# Patient Record
Sex: Female | Born: 1975 | Race: Black or African American | Hispanic: No | Marital: Married | Smoking: Never smoker
Health system: Southern US, Community
[De-identification: ages and names within clinical notes are randomized; demographics above are authoritative.]

## PROBLEM LIST (undated history)

## (undated) DIAGNOSIS — L255 Unspecified contact dermatitis due to plants, except food: Secondary | ICD-10-CM

## (undated) DIAGNOSIS — M722 Plantar fascial fibromatosis: Secondary | ICD-10-CM

## (undated) DIAGNOSIS — M76821 Posterior tibial tendinitis, right leg: Secondary | ICD-10-CM

## (undated) DIAGNOSIS — L600 Ingrowing nail: Secondary | ICD-10-CM

## (undated) DIAGNOSIS — M76822 Posterior tibial tendinitis, left leg: Secondary | ICD-10-CM

## (undated) DIAGNOSIS — I10 Essential (primary) hypertension: Secondary | ICD-10-CM

## (undated) HISTORY — DX: Posterior tibial tendinitis, right leg: M76.821

## (undated) HISTORY — DX: Posterior tibial tendinitis, right leg: M76.822

## (undated) HISTORY — DX: Plantar fascial fibromatosis: M72.2

## (undated) HISTORY — DX: Ingrowing nail: L60.0

## (undated) HISTORY — PX: TUBAL LIGATION: SHX77

## (undated) HISTORY — DX: Essential (primary) hypertension: I10

## (undated) HISTORY — DX: Unspecified contact dermatitis due to plants, except food: L25.5

---

## 1998-12-22 ENCOUNTER — Emergency Department (HOSPITAL_COMMUNITY): Admission: EM | Admit: 1998-12-22 | Discharge: 1998-12-22 | Payer: Self-pay | Admitting: Emergency Medicine

## 2008-07-29 ENCOUNTER — Ambulatory Visit: Payer: Self-pay | Admitting: Diagnostic Radiology

## 2008-07-29 ENCOUNTER — Emergency Department (HOSPITAL_BASED_OUTPATIENT_CLINIC_OR_DEPARTMENT_OTHER): Admission: EM | Admit: 2008-07-29 | Discharge: 2008-07-29 | Payer: Self-pay | Admitting: Emergency Medicine

## 2009-09-05 ENCOUNTER — Emergency Department (HOSPITAL_BASED_OUTPATIENT_CLINIC_OR_DEPARTMENT_OTHER): Admission: EM | Admit: 2009-09-05 | Discharge: 2009-09-05 | Payer: Self-pay | Admitting: Emergency Medicine

## 2010-04-26 ENCOUNTER — Encounter: Payer: Self-pay | Admitting: Specialist

## 2013-01-15 DIAGNOSIS — I519 Heart disease, unspecified: Secondary | ICD-10-CM | POA: Insufficient documentation

## 2013-01-30 DIAGNOSIS — I10 Essential (primary) hypertension: Secondary | ICD-10-CM | POA: Insufficient documentation

## 2013-01-31 DIAGNOSIS — E559 Vitamin D deficiency, unspecified: Secondary | ICD-10-CM | POA: Insufficient documentation

## 2013-07-19 DIAGNOSIS — J309 Allergic rhinitis, unspecified: Secondary | ICD-10-CM | POA: Insufficient documentation

## 2013-07-19 DIAGNOSIS — Z6841 Body Mass Index (BMI) 40.0 and over, adult: Secondary | ICD-10-CM | POA: Insufficient documentation

## 2013-07-19 DIAGNOSIS — G4733 Obstructive sleep apnea (adult) (pediatric): Secondary | ICD-10-CM | POA: Insufficient documentation

## 2014-02-14 ENCOUNTER — Other Ambulatory Visit: Payer: Self-pay | Admitting: *Deleted

## 2014-02-14 ENCOUNTER — Ambulatory Visit (INDEPENDENT_AMBULATORY_CARE_PROVIDER_SITE_OTHER): Payer: BC Managed Care – PPO

## 2014-02-14 ENCOUNTER — Encounter: Payer: Self-pay | Admitting: Podiatry

## 2014-02-14 ENCOUNTER — Ambulatory Visit (INDEPENDENT_AMBULATORY_CARE_PROVIDER_SITE_OTHER): Payer: BC Managed Care – PPO | Admitting: Podiatry

## 2014-02-14 VITALS — BP 162/95 | HR 86 | Resp 16 | Ht 61.5 in | Wt 265.0 lb

## 2014-02-14 DIAGNOSIS — M722 Plantar fascial fibromatosis: Secondary | ICD-10-CM | POA: Diagnosis not present

## 2014-02-14 DIAGNOSIS — G629 Polyneuropathy, unspecified: Secondary | ICD-10-CM

## 2014-02-14 MED ORDER — MELOXICAM 15 MG PO TABS: 15.0000 mg | ORAL_TABLET | Freq: Every day | ORAL | Status: DC

## 2014-02-14 MED ORDER — METHYLPREDNISOLONE (PAK) 4 MG PO TABS: ORAL_TABLET | ORAL | Status: DC

## 2014-02-14 NOTE — Progress Notes (Signed)
   Subjective:    Patient ID: Melissa Norris, female    DOB: 1975-11-11, 38 y.o.   MRN: [id number]  HPI Comments: Left foot was having heel pain , it has been going on for about a year now maybe more . Saw another doctor it got better for about 3 months , but it continued and stop seeing the other  Doctor , got frustrated with it. Its worse in the mornings can hardly walk. But now both feet are burning and stinging. It just started a couple of months ago.   Foot Pain      Review of Systems  All other systems reviewed and are negative.      Objective:   Physical Exam: I have reviewed her past history medications allergies surgery social history and review of systems. Pulses are strongly palpable bilateral. Neurologic sensorium is intact per Semmes-Weinstein monofilament. Deep tendon reflexes are intact bilateral muscle strength is 5 over 5 dorsiflexion plantar flexors and inverters and evertors onto the musculature is intact. Orthopedic evaluation demonstrates mild pes planus bilateral pain on palpation medially continue to both the left heel. Radiographs confirm soft tissue increase in density at the plantar fascial insertion site left.        Assessment & Plan:  Assessment: Neuropathic changes secondary to history. Plantar fasciitis left.  Plan: Discussed etiology pathology conservative versus surgical therapies we discussed appropriate shoe gear stretching exercises ice therapy and she modifications. Injected the left heel today with Kenalog and local anesthetic dispensed a night brace as well as a plantar fascial brace. Discussed appropriate shoes wrote a prescription for Medrol Dosepak to be followed by meloxicam. I will follow-up with her in 1 month.

## 2014-02-14 NOTE — Patient Instructions (Signed)

## 2014-03-14 ENCOUNTER — Ambulatory Visit: Payer: BC Managed Care – PPO | Admitting: Podiatry

## 2014-03-21 ENCOUNTER — Ambulatory Visit (INDEPENDENT_AMBULATORY_CARE_PROVIDER_SITE_OTHER): Payer: BC Managed Care – PPO | Admitting: Podiatry

## 2014-03-21 ENCOUNTER — Encounter: Payer: Self-pay | Admitting: Podiatry

## 2014-03-21 ENCOUNTER — Ambulatory Visit: Payer: BC Managed Care – PPO | Admitting: Podiatry

## 2014-03-21 DIAGNOSIS — M722 Plantar fascial fibromatosis: Secondary | ICD-10-CM

## 2014-03-23 NOTE — Progress Notes (Signed)
She presents today for follow-up of her left heel pain. She states that is recurring.  Objective: Vital signs are stable she is alert and oriented 3 pulses are palpable. No calf pain left though she does have pain on palpation medial calcaneal tubercle of the left foot.  Assessment: Chronic intractable plantar fasciitis left foot.  Plan: Injected the left heel again today with Kenalog and local anesthetic. Continue all conservative therapies. She was also scanned for set of orthotics. I will follow up with her once those arrive.

## 2014-04-08 ENCOUNTER — Ambulatory Visit: Payer: BC Managed Care – PPO | Admitting: *Deleted

## 2014-04-08 DIAGNOSIS — M722 Plantar fascial fibromatosis: Secondary | ICD-10-CM

## 2014-04-08 NOTE — Progress Notes (Signed)
Patient presents for orthotic pick up  

## 2014-04-08 NOTE — Patient Instructions (Signed)

## 2014-08-02 ENCOUNTER — Other Ambulatory Visit: Payer: Self-pay | Admitting: Podiatry

## 2014-09-10 ENCOUNTER — Other Ambulatory Visit: Payer: Self-pay | Admitting: Podiatry

## 2014-10-23 ENCOUNTER — Other Ambulatory Visit: Payer: Self-pay | Admitting: Podiatry

## 2015-03-03 DIAGNOSIS — R202 Paresthesia of skin: Secondary | ICD-10-CM | POA: Insufficient documentation

## 2015-03-11 ENCOUNTER — Encounter: Payer: Self-pay | Admitting: Podiatry

## 2015-03-11 ENCOUNTER — Ambulatory Visit: Payer: 59 | Admitting: Podiatry

## 2015-03-11 ENCOUNTER — Ambulatory Visit (INDEPENDENT_AMBULATORY_CARE_PROVIDER_SITE_OTHER): Payer: 59 | Admitting: Podiatry

## 2015-03-11 VITALS — BP 118/69 | HR 89 | Resp 16

## 2015-03-11 DIAGNOSIS — M722 Plantar fascial fibromatosis: Secondary | ICD-10-CM | POA: Diagnosis not present

## 2015-03-11 MED ORDER — DICLOFENAC SODIUM 75 MG PO TBEC: 75.0000 mg | DELAYED_RELEASE_TABLET | Freq: Two times a day (BID) | ORAL | Status: DC

## 2015-03-11 MED ORDER — METHYLPREDNISOLONE 4 MG PO TBPK: ORAL_TABLET | ORAL | Status: DC

## 2015-03-11 NOTE — Progress Notes (Signed)
She presents today for follow-up of her plantar fasciitis of her left foot. She states that it went away for quite a while and then has resurfaced. She's done nothing for it.  Objective: Vital signs are stable she is alert and oriented 3. Ulcers are strongly palpable. Neurologic sensorium is intact. She has pain on palpation medial calcaneal tubercle of the left heel.  Assessment: Pain in limb secondary to plantar fasciitis left.  Plan: Start her on a Medrol Dosepak to be followed by meloxicam. Injected the left heel today. Discussed the need for orthotics which she has lost over the past year. I will follow-up with her in approximately one month.

## 2015-04-22 ENCOUNTER — Ambulatory Visit: Payer: 59 | Admitting: Podiatry

## 2015-05-01 ENCOUNTER — Ambulatory Visit (INDEPENDENT_AMBULATORY_CARE_PROVIDER_SITE_OTHER): Payer: BLUE CROSS/BLUE SHIELD | Admitting: Podiatry

## 2015-05-01 ENCOUNTER — Encounter: Payer: Self-pay | Admitting: Podiatry

## 2015-05-01 VITALS — BP 136/82 | HR 76 | Resp 12

## 2015-05-01 DIAGNOSIS — M722 Plantar fascial fibromatosis: Secondary | ICD-10-CM | POA: Diagnosis not present

## 2015-05-01 NOTE — Progress Notes (Signed)
She states that she presents today for follow-up of plantar fasciitis to her left heel. She states that it was doing much better for a week or 2 and then the medicine stopped helping. She says however when it wore off she is still 50% better than she was prior to getting an injection.  Objective: Vital signs stable alert and oriented 3. Pulses are strongly palpable. She has pain on palpation medial calcaneal tubercle of the left heel.  Assessment: Chronic intractable plantar fasciitis of the left foot slowly resolving approximately 50%.  Plan: Discussed etiology pathology conservative versus surgical therapies at this point I reinjected her left tilt today she will continue anti-inflammatories and we discussed the possible need for surgery. I will follow-up with her in 1 month at which time we will consider orthotics and discussed surgery if necessary.

## 2015-06-03 ENCOUNTER — Ambulatory Visit: Payer: BLUE CROSS/BLUE SHIELD | Admitting: Podiatry

## 2015-06-12 ENCOUNTER — Ambulatory Visit (INDEPENDENT_AMBULATORY_CARE_PROVIDER_SITE_OTHER): Payer: BLUE CROSS/BLUE SHIELD | Admitting: Podiatry

## 2015-06-12 ENCOUNTER — Encounter: Payer: Self-pay | Admitting: Podiatry

## 2015-06-12 VITALS — BP 113/80 | HR 92 | Resp 12

## 2015-06-12 DIAGNOSIS — M722 Plantar fascial fibromatosis: Secondary | ICD-10-CM

## 2015-06-12 NOTE — Progress Notes (Signed)
She presents today for follow-up of her plantar fasciitis her left foot. She states that she is approximately 60% improved at this point.  Objective: Vital signs are stable she is alert and oriented 3. Pulses are palpable. She has pain on palpation medially continue tubercle of her left heel.  Assessment: Chronic intractable plantar fasciitis left heel.  Plan: She was scanned for some orthotics today and local anesthetic with Kenalog was injected her left heel. She will continue the use of all of her conservative therapies including plantar fascial brace night splint and medications. Follow up with her once her orthotics come in.

## 2015-07-08 ENCOUNTER — Ambulatory Visit: Payer: BLUE CROSS/BLUE SHIELD | Admitting: *Deleted

## 2015-07-08 DIAGNOSIS — M722 Plantar fascial fibromatosis: Secondary | ICD-10-CM

## 2015-07-08 NOTE — Patient Instructions (Signed)

## 2015-07-08 NOTE — Progress Notes (Signed)
Patient ID: Melissa Norris, female   DOB: Oct 13, 1975, 40 y.o.   MRN: [id number] Patient presents for orthotic pick up.  Verbal and written break in and wear instructions given.  Patient will follow up in 4 weeks if symptoms worsen or fail to improve.

## 2016-07-27 ENCOUNTER — Ambulatory Visit (INDEPENDENT_AMBULATORY_CARE_PROVIDER_SITE_OTHER): Payer: BLUE CROSS/BLUE SHIELD | Admitting: Podiatry

## 2016-07-27 ENCOUNTER — Encounter: Payer: Self-pay | Admitting: Podiatry

## 2016-07-27 ENCOUNTER — Ambulatory Visit: Payer: BLUE CROSS/BLUE SHIELD

## 2016-07-27 ENCOUNTER — Ambulatory Visit (INDEPENDENT_AMBULATORY_CARE_PROVIDER_SITE_OTHER): Payer: BLUE CROSS/BLUE SHIELD

## 2016-07-27 VITALS — BP 130/91 | HR 83 | Resp 16

## 2016-07-27 DIAGNOSIS — M79671 Pain in right foot: Secondary | ICD-10-CM

## 2016-07-27 DIAGNOSIS — M722 Plantar fascial fibromatosis: Secondary | ICD-10-CM

## 2016-07-27 MED ORDER — METHYLPREDNISOLONE 4 MG PO TBPK
ORAL_TABLET | ORAL | 0 refills | Status: DC
Start: 2016-07-27 — End: 2016-12-20

## 2016-07-27 MED ORDER — MELOXICAM 15 MG PO TABS: 15.0000 mg | ORAL_TABLET | Freq: Every day | ORAL | 3 refills | Status: DC

## 2016-07-27 NOTE — Progress Notes (Signed)
She presents today with a chief complaint of painful heels bilaterally. She states that I think he'll have a recurrence. She states the left foot seems to be doing a little better but the right one has started active lately.  Objective: Vital signs are stable she is alert and oriented 3. Pulses are palpable. Neurologic sensorium is intact. She has pain on palpation medially intervals bilateral radiographs taken today demonstrate soft tissue increase in density cannula insertion site consistent of plantar fasciitis.  Assessment: Plantar fasciitis bilateral.  Plan: Injected the bilateral heels today with Kenalog and local anesthetic she was started utilizing her plantar fascia brace to the left foot which she has at home and placed her in a new one for the right foot. He also place her in a knee plantar fascia brace night splint. Start her on a Medrol Dosepak to be followed by meloxicam. Discussed appropriate shoe gear stretching exercises ice therapy and shoe modifications. I will follow up with her in 1 month.

## 2016-08-31 ENCOUNTER — Ambulatory Visit: Payer: BLUE CROSS/BLUE SHIELD | Admitting: Podiatry

## 2016-09-09 ENCOUNTER — Encounter: Payer: Self-pay | Admitting: Podiatry

## 2016-09-09 ENCOUNTER — Ambulatory Visit (INDEPENDENT_AMBULATORY_CARE_PROVIDER_SITE_OTHER): Payer: BLUE CROSS/BLUE SHIELD | Admitting: Podiatry

## 2016-09-09 DIAGNOSIS — M722 Plantar fascial fibromatosis: Secondary | ICD-10-CM | POA: Diagnosis not present

## 2016-09-09 NOTE — Progress Notes (Signed)
She presents today for follow-up of her plantar fasciitis she states that is doing somewhat better particularly the left was doing really well around so painful.  Objective: Vital signs stable she is alert and oriented 3. Pulses are palpable. She still has pain on palpation medial calcaneal tubercles bilateral.  Assessment: Pain and limb secondary to plantar fasciitis bilateral.  Plan: Reinjection bilateral heels lived alone local anesthetic. Follow-up with her in 1 month if necessary.

## 2016-10-21 ENCOUNTER — Ambulatory Visit: Payer: BLUE CROSS/BLUE SHIELD | Admitting: Podiatry

## 2016-12-20 ENCOUNTER — Encounter (HOSPITAL_BASED_OUTPATIENT_CLINIC_OR_DEPARTMENT_OTHER): Payer: Self-pay

## 2016-12-20 ENCOUNTER — Emergency Department (HOSPITAL_BASED_OUTPATIENT_CLINIC_OR_DEPARTMENT_OTHER)
Admission: EM | Admit: 2016-12-20 | Discharge: 2016-12-20 | Disposition: A | Discharge status: Home / Self Care | Payer: BLUE CROSS/BLUE SHIELD | Attending: Emergency Medicine | Admitting: Emergency Medicine

## 2016-12-20 DIAGNOSIS — Z79899 Other long term (current) drug therapy: Secondary | ICD-10-CM | POA: Insufficient documentation

## 2016-12-20 DIAGNOSIS — M546 Pain in thoracic spine: Secondary | ICD-10-CM | POA: Insufficient documentation

## 2016-12-20 DIAGNOSIS — M549 Dorsalgia, unspecified: Secondary | ICD-10-CM | POA: Diagnosis present

## 2016-12-20 DIAGNOSIS — I10 Essential (primary) hypertension: Secondary | ICD-10-CM | POA: Insufficient documentation

## 2016-12-20 MED ORDER — BACLOFEN 10 MG PO TABS: 10.0000 mg | ORAL_TABLET | Freq: Three times a day (TID) | ORAL | 0 refills | Status: DC

## 2016-12-20 MED ORDER — NAPROXEN 375 MG PO TABS: 375.0000 mg | ORAL_TABLET | Freq: Two times a day (BID) | ORAL | 0 refills | Status: DC

## 2016-12-20 NOTE — ED Triage Notes (Signed)
C/o pain to right mid back/rib area-pain worse with deep breath and worse with movement x 3 days-denies injury-NAD-steady gait

## 2016-12-20 NOTE — ED Provider Notes (Signed)
[facility]   CSN: [id number] Arrival date & time: 12/20/16  2028     History   Chief Complaint Chief Complaint  Patient presents with  . Back Pain    HPI Melissa Norris is a 41 y.o. female.  Melissa Norris is a 41 y.o. Female who presents in the emergency department complaining of right upper back pain for the past 3 days. Patient reports she has pain to the lateral aspect of her right upper back ongoing for 3 days now. She reports her pain is worse with movement. Her pain is not worse with deep inspiration. She reports her pain is better with massage by her husband. She took naproxen a few days ago with little relief of her symptoms. She denies any injury or trauma to her back. 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 She denies having any chest pain or shortness of breath. She denies fevers, coughing, shortness of breath, palpitations, chest pain, abdominal pain, vomiting, loss of bladder control, loss of bowel control, urinary symptoms, numbness, tingling, weakness, history of cancer, history of drug use, or rashes.   The history is provided by the patient, medical records and the spouse. No language interpreter was used.  Back Pain   Pertinent negatives include no chest pain, no fever, no numbness, no dysuria and no weakness.    Past Medical History:  Diagnosis Date  . Hypertension     Patient Active Problem List   Diagnosis Date Noted  . Hand paresthesia 03/03/2015  . Body mass index (BMI) of 50-59.9 in adult (HCC) 07/19/2013  . Allergic rhinitis 07/19/2013  . Obstructive apnea 07/19/2013  . Avitaminosis D 01/31/2013  . Essential (primary) hypertension 01/30/2013  . Cardiac disease 01/15/2013    Past Surgical History:  Procedure Laterality Date  . TUBAL LIGATION      OB History    No data available       Home Medications    Prior to  Admission medications   Medication Sig Start Date End Date Taking? Authorizing Provider  baclofen (LIORESAL) 10 MG tablet Take 1 tablet (10 mg total) by mouth 3 (three) times daily. 12/20/16   Everlene Farrier, PA-C  carvedilol (COREG) 25 MG tablet  12/14/13   [provider]  Cyanocobalamin (VITAMIN B-12 PO) Take 1 tablet by mouth daily.    [provider]  ergocalciferol (VITAMIN D2) 50000 UNITS capsule  01/08/15   [provider]  hydrochlorothiazide (HYDRODIURIL) 25 MG tablet  02/12/15   [provider]  naproxen (NAPROSYN) 375 MG tablet Take 1 tablet (375 mg total) by mouth 2 (two) times daily with a meal. 12/20/16   Everlene Farrier, PA-C  Vitamin D, Ergocalciferol, (DRISDOL) 50000 UNITS CAPS capsule Take by mouth. 01/31/13   [provider]    Family History No family history on file.  Social History Social History  Substance Use Topics  . Smoking status: Never Smoker  . Smokeless tobacco: Never Used  . Alcohol use No     Allergies   Patient has no known allergies.   Review of Systems Review of Systems  Constitutional: Negative for fever.  Respiratory: Negative for cough and shortness of breath.   Cardiovascular: Negative for chest pain and palpitations.  Gastrointestinal: Negative for vomiting.  Genitourinary: Negative for difficulty urinating, dysuria, flank pain, frequency and hematuria.  Musculoskeletal: Positive for back pain. Negative for neck pain.  Skin: Negative for rash and wound.  Neurological: Negative for dizziness, weakness, light-headedness and numbness.     Physical Exam Updated Vital Signs BP 138/89 (BP Location: Left Arm)   Pulse 94   Temp 99.2 F (37.3 C) (Oral)   Resp 20   Ht  (1.549 m)   Wt 135.6 kg (299 lb)   LMP 12/11/2016   SpO2 98%   BMI 56.50 kg/m   Physical Exam  Constitutional: She appears well-developed and well-nourished. No distress.  Nontoxic appearing.  HENT:  Head:  Normocephalic and atraumatic.  Eyes: Conjunctivae are normal. Right eye exhibits no discharge. Left eye exhibits no discharge.  Neck: Neck supple. No JVD present.  Cardiovascular: Normal rate, regular rhythm, normal heart sounds and intact distal pulses.   Pulmonary/Chest: Effort normal and breath sounds normal. No respiratory distress. She has no wheezes. She has no rales. She exhibits no tenderness.  Lungs are clear to ascultation bilaterally. Symmetric chest expansion bilaterally. No increased work of breathing. No rales or rhonchi.    Abdominal: Soft. There is no tenderness. There is no guarding.  Musculoskeletal: Normal range of motion. She exhibits tenderness. She exhibits no edema or deformity.       Back:  No midline neck or back tenderness palpation. Patient has reproducible pain to the lateral aspect of her right upper back along her rhomboid musculature. No overlying skin changes. Good strength in her bilateral upper extremities.  Lymphadenopathy:    She has no cervical adenopathy.  Neurological: She is alert. She displays normal reflexes. No sensory deficit. She exhibits normal muscle tone. Coordination normal.  Sensation and strength is intact to her bilateral upper and lower extremities. Bilateral patellar DTRs are intact. Normal gait.  Skin: Skin is warm and dry. Capillary refill takes less than 2 seconds. No rash noted. She is not diaphoretic. No erythema. No pallor.  Psychiatric: She has a normal mood and affect. Her behavior is normal.  Nursing note and vitals reviewed.    ED Treatments / Results  Labs (all labs ordered are listed, but only abnormal results are displayed) Labs Reviewed - No data to display  EKG  EKG Interpretation None       Radiology No results found.  Procedures Procedures (including critical care time)  Medications Ordered in ED Medications - No data to display   Initial Impression / Assessment and Plan / ED Course  I have reviewed the  triage vital signs and the nursing notes.  Pertinent labs & imaging results that were available during my care of the patient were reviewed by me and considered in my medical decision making (see chart for details).     This  is a 41 y.o. Female who presents in the emergency department complaining of right upper back pain for the past 3 days. Patient reports she has pain to the lateral aspect of her right upper back ongoing for 3 days now. She reports her pain is worse with movement. Her pain is not worse with deep inspiration. She reports her pain is better with massage by her husband. She took naproxen a few days ago with little relief of her symptoms. She denies any injury or trauma to her back. She does report she carries a 17-month-old baby frequently with this right arm and believes this may be contributing to her symptoms. She denies having any chest pain or shortness of breath. On exam the patient is afebrile nontoxic appearing. She has no focal neurological deficits. Normal gait. No  midline neck or back tenderness. No loss of bowel or bladder control. She has reproducible tenderness to her right lateral thoracic back along her rhomboid musculature. No midline neck or back tenderness. No overlying skin changes. This seems to be musculoskeletal pain. She has no chest pain. We will discharge with prescription for flexeril and naproxen. I discussed precautions in taking baclofen. She is not breast-feeding. I encouraged her to follow up with primary care and discussed return precautions. I advised the patient to follow-up with their primary care provider this week. I advised the patient to return to the emergency department with new or worsening symptoms or new concerns. The patient verbalized understanding and agreement with plan.      Final Clinical Impressions(s) / ED Diagnoses   Final diagnoses:  Acute right-sided thoracic back pain    New Prescriptions New Prescriptions   BACLOFEN  (LIORESAL) 10 MG TABLET    Take 1 tablet (10 mg total) by mouth 3 (three) times daily.   NAPROXEN (NAPROSYN) 375 MG TABLET    Take 1 tablet (375 mg total) by mouth 2 (two) times daily with a meal.     Everlene Farrier, PA-C 12/20/16 2318    Lavera Guise, MD 12/21/16 [phone]

## 2017-06-17 ENCOUNTER — Other Ambulatory Visit: Payer: Self-pay | Admitting: Podiatry

## 2017-11-03 ENCOUNTER — Ambulatory Visit: Payer: BLUE CROSS/BLUE SHIELD | Admitting: Podiatry

## 2017-11-03 ENCOUNTER — Encounter: Payer: Self-pay | Admitting: Podiatry

## 2017-11-03 DIAGNOSIS — M722 Plantar fascial fibromatosis: Secondary | ICD-10-CM

## 2017-11-03 MED ORDER — MELOXICAM 15 MG PO TABS: 15.0000 mg | ORAL_TABLET | Freq: Every day | ORAL | 3 refills | Status: DC

## 2017-11-05 NOTE — Progress Notes (Signed)
She presents today for follow-up of bilateral heels states that is sharp shooting pains coming from the arch shooting up into the heels and ankles.  Last time I saw her for this was September 09, 2016.  States that she is currently now working for [employer].  Objective: Vital signs are stable she is alert and oriented x3.  Pulses are palpable.  Neurologic sensorium is intact degenerative flexors are intact muscle strength normal symmetrical.  PT evaluation and straight all joints distal ankle full range of motion without crepitation.  She has pain on palpation medial calcaneal tubercles bilateral consistent with plantar fasciitis.  Assessment: Plantar fasciitis bilateral.  Plan: I reinjected her today with 20 mg Kenalog 5 mg Marcaine point maximal tenderness medial aspect of bilateral heels.  Tolerated procedure well.  I also had her size for orthotics again today.  I will follow-up with her once this come in.  I refilled her meloxicam.

## 2017-11-08 ENCOUNTER — Telehealth: Payer: Self-pay | Admitting: Podiatry

## 2017-11-08 NOTE — Telephone Encounter (Signed)
Called pt with orthotic benefits and they are covered at 100% if billed with office visit and pt pays $20.00 copay. Pt would like to proceed with the orthotics.

## 2017-11-24 ENCOUNTER — Ambulatory Visit: Payer: BLUE CROSS/BLUE SHIELD | Admitting: Podiatry

## 2017-11-24 ENCOUNTER — Encounter: Payer: Self-pay | Admitting: Podiatry

## 2017-11-24 DIAGNOSIS — M76822 Posterior tibial tendinitis, left leg: Secondary | ICD-10-CM

## 2017-11-24 NOTE — Progress Notes (Signed)
She presents today for follow-up of bilateral plantar fasciitis and to pick up her orthotics.  She states that her orthotics hopefully will help.  She states that her right foot is doing very well.  Her left foot is still painful.  Objective: Vital signs are stable she is alert and oriented x3.  Pulses are palpable.  No pain on palpation medial calcaneal tubercle of the right foot.  No pain on palpation of the plantar fascia of the left foot but she does have pain on palpation of the posterior tibial tendon near its insertion site on the navicular tuberosity and she also has pain on inversion against resistance.  Assessment: Pain in limb secondary to posterior tibial tendinitis more than likely compensatory from plantar fasciitis.  Well-healed plantar fasciitis right foot.  Plan: At this point I injected a small amount of Kenalog 20 mg and 10 mg of Marcaine to the point of maximal tenderness not going into the tendon but around the tendon and tendon sheath of the posterior tibial tendon near its insertion on the navicular.  She tolerated this procedure well and I expect her to start wearing her orthotics immediately and I will follow-up with her in 1 month as will Raiford NobleRick if any adjustments need to be made.

## 2017-12-22 ENCOUNTER — Ambulatory Visit: Payer: BLUE CROSS/BLUE SHIELD | Admitting: Podiatry

## 2017-12-22 ENCOUNTER — Other Ambulatory Visit: Payer: BLUE CROSS/BLUE SHIELD | Admitting: Orthotics

## 2017-12-28 ENCOUNTER — Emergency Department (HOSPITAL_BASED_OUTPATIENT_CLINIC_OR_DEPARTMENT_OTHER)
Admission: EM | Admit: 2017-12-28 | Discharge: 2017-12-28 | Disposition: A | Discharge status: Home / Self Care | Payer: BLUE CROSS/BLUE SHIELD | Attending: Emergency Medicine | Admitting: Emergency Medicine

## 2017-12-28 ENCOUNTER — Encounter (HOSPITAL_BASED_OUTPATIENT_CLINIC_OR_DEPARTMENT_OTHER): Payer: Self-pay | Admitting: Emergency Medicine

## 2017-12-28 ENCOUNTER — Other Ambulatory Visit: Payer: Self-pay

## 2017-12-28 ENCOUNTER — Emergency Department (HOSPITAL_BASED_OUTPATIENT_CLINIC_OR_DEPARTMENT_OTHER): Payer: BLUE CROSS/BLUE SHIELD

## 2017-12-28 DIAGNOSIS — Y939 Activity, unspecified: Secondary | ICD-10-CM | POA: Insufficient documentation

## 2017-12-28 DIAGNOSIS — S233XXA Sprain of ligaments of thoracic spine, initial encounter: Secondary | ICD-10-CM | POA: Insufficient documentation

## 2017-12-28 DIAGNOSIS — S239XXA Sprain of unspecified parts of thorax, initial encounter: Secondary | ICD-10-CM

## 2017-12-28 DIAGNOSIS — Z79899 Other long term (current) drug therapy: Secondary | ICD-10-CM | POA: Insufficient documentation

## 2017-12-28 DIAGNOSIS — Y999 Unspecified external cause status: Secondary | ICD-10-CM | POA: Diagnosis not present

## 2017-12-28 DIAGNOSIS — I10 Essential (primary) hypertension: Secondary | ICD-10-CM | POA: Diagnosis not present

## 2017-12-28 DIAGNOSIS — X58XXXA Exposure to other specified factors, initial encounter: Secondary | ICD-10-CM | POA: Diagnosis not present

## 2017-12-28 DIAGNOSIS — Y929 Unspecified place or not applicable: Secondary | ICD-10-CM | POA: Insufficient documentation

## 2017-12-28 DIAGNOSIS — S299XXA Unspecified injury of thorax, initial encounter: Secondary | ICD-10-CM | POA: Diagnosis present

## 2017-12-28 MED ORDER — DICLOFENAC SODIUM 1 % TD GEL: 4.0000 g | Freq: Four times a day (QID) | TRANSDERMAL | 0 refills | Status: DC

## 2017-12-28 MED ORDER — OXYCODONE HCL 5 MG PO TABS
5.0000 mg | ORAL_TABLET | Freq: Once | ORAL | Status: AC
  Administered 2017-12-28: 5 mg via ORAL
  Filled 2017-12-28: qty 1

## 2017-12-28 MED ORDER — DIAZEPAM 2 MG PO TABS
2.0000 mg | ORAL_TABLET | Freq: Once | ORAL | Status: AC
  Administered 2017-12-28: 2 mg via ORAL
  Filled 2017-12-28: qty 1

## 2017-12-28 MED ORDER — KETOROLAC TROMETHAMINE 15 MG/ML IJ SOLN
15.0000 mg | Freq: Once | INTRAMUSCULAR | Status: AC
  Administered 2017-12-28: 15 mg via INTRAMUSCULAR
  Filled 2017-12-28: qty 1

## 2017-12-28 MED ORDER — ACETAMINOPHEN 500 MG PO TABS
1000.0000 mg | ORAL_TABLET | Freq: Once | ORAL | Status: AC
  Administered 2017-12-28: 1000 mg via ORAL
  Filled 2017-12-28: qty 2

## 2017-12-28 NOTE — ED Triage Notes (Addendum)
Back pain and is seeing a Chiropractor and PCP but nothing is helping. Pain x 2 weeks, denies injury . Mid to upper back pain

## 2017-12-28 NOTE — ED Notes (Signed)
ED Provider at bedside. 

## 2017-12-28 NOTE — ED Provider Notes (Signed)
[facility] EMERGENCY DEPARTMENT Provider Note   CSN: [id number] Arrival date & time: 12/28/17  1656     History   Chief Complaint Chief Complaint  Patient presents with  . Back Pain    HPI Melissa Norris is a 42 y.o. female.  42 yo F with a chief complaint of right upper back pain.  This been going on for the past couple weeks.  She has seen her family doctor as well as a Land.  She has been on steroids and methocarbamol.  Has not had any improvement with this.  She has some transient improvement and then has not worsened.  No trauma no cough congestion or fever.  Has some pain with deep breathing.  The history is provided by the patient.  Back Pain   This is a new problem. The current episode started more than 1 week ago. The problem occurs constantly. The problem has not changed since onset.The pain is associated with no known injury. The pain is present in the thoracic spine. The quality of the pain is described as stabbing and shooting. The pain does not radiate. The pain is at a severity of 7/10. The pain is moderate. The symptoms are aggravated by bending and twisting. Pertinent negatives include no chest pain, no fever, no headaches and no dysuria. She has tried muscle relaxants for the symptoms. The treatment provided no relief. Risk factors include obesity and lack of exercise.    Past Medical History:  Diagnosis Date  . Hypertension     Patient Active Problem List   Diagnosis Date Noted  . Hand paresthesia 03/03/2015  . Body mass index (BMI) of 50-59.9 in adult (HCC) 07/19/2013  . Allergic rhinitis 07/19/2013  . Obstructive apnea 07/19/2013  . Avitaminosis D 01/31/2013  . Essential (primary) hypertension 01/30/2013  . Cardiac disease 01/15/2013    Past Surgical History:  Procedure Laterality Date  . TUBAL LIGATION       OB History   None      Home Medications    Prior to Admission medications   Medication Sig Start Date End Date  Taking? Authorizing Provider  amLODipine (NORVASC) 5 MG tablet  10/28/17   [provider]  baclofen (LIORESAL) 10 MG tablet Take 1 tablet (10 mg total) by mouth 3 (three) times daily. 12/20/16   Everlene Farrier, PA-C  carvedilol (COREG) 25 MG tablet  12/14/13   [provider]  chlorpheniramine-HYDROcodone (TUSSIONEX) 10-8 MG/5ML SUER  10/15/17   [provider]  Cyanocobalamin (VITAMIN B-12 PO) Take 1 tablet by mouth daily.    [provider]  diclofenac sodium (VOLTAREN) 1 % GEL Apply 4 g topically 4 (four) times daily. 12/28/17   Melene Plan, DO  ergocalciferol (VITAMIN D2) 50000 UNITS capsule  01/08/15   [provider]  hydrochlorothiazide (HYDRODIURIL) 25 MG tablet  02/12/15   [provider]  meloxicam (MOBIC) 15 MG tablet Take 1 tablet (15 mg total) by mouth daily. 11/03/17   Hyatt, Max T, DPM  naproxen (NAPROSYN) 375 MG tablet Take 1 tablet (375 mg total) by mouth 2 (two) times daily with a meal. 12/20/16   Everlene Farrier, PA-C  neomycin-polymyxin b-dexamethasone (MAXITROL) 3.5-10000-0.1 SUSP  10/15/17   [provider]  Vitamin D, Ergocalciferol, (DRISDOL) 50000 UNITS CAPS capsule Take by mouth. 01/31/13   [provider]    Family History No family history on file.  Social History Social History   Tobacco Use  . Smoking status: Never Smoker  .  Smokeless tobacco: Never Used  Substance Use Topics  . Alcohol use: No    Alcohol/week: 0.0 standard drinks  . Drug use: No     Allergies   Patient has no known allergies.   Review of Systems Review of Systems  Constitutional: Negative for chills and fever.  HENT: Negative for congestion and rhinorrhea.   Eyes: Negative for redness and visual disturbance.  Respiratory: Negative for shortness of breath and wheezing.   Cardiovascular: Negative for chest pain and palpitations.  Gastrointestinal: Negative for nausea and vomiting.  Genitourinary: Negative for dysuria  and urgency.  Musculoskeletal: Positive for back pain. Negative for arthralgias and myalgias.  Skin: Negative for pallor and wound.  Neurological: Negative for dizziness and headaches.     Physical Exam Updated Vital Signs BP 135/73 (BP Location: Right Arm)   Pulse 82   Temp 98.8 F (37.1 C) (Oral)   Resp 16   Ht 5\' 1"  (1.549 m)   Wt 127 kg   LMP 12/08/2017   SpO2 100%   BMI 52.91 kg/m   Physical Exam  Constitutional: She is oriented to person, place, and time. She appears well-developed and well-nourished. No distress.  HENT:  Head: Normocephalic and atraumatic.  Eyes: Pupils are equal, round, and reactive to light. EOM are normal.  Neck: Normal range of motion. Neck supple.  Cardiovascular: Normal rate and regular rhythm. Exam reveals no gallop and no friction rub.  No murmur heard. Pulmonary/Chest: Effort normal. She has no wheezes. She has no rales.  Abdominal: Soft. She exhibits no distension. There is no tenderness.  Musculoskeletal: She exhibits tenderness. She exhibits no edema.  Mild tenderness of the right upper back in between the scapula and the midline, about T 7-9.  No noted midline pain.    Neurological: She is alert and oriented to person, place, and time.  Skin: Skin is warm and dry. She is not diaphoretic.  Psychiatric: She has a normal mood and affect. Her behavior is normal.  Nursing note and vitals reviewed.    ED Treatments / Results  Labs (all labs ordered are listed, but only abnormal results are displayed) Labs Reviewed - No data to display  EKG None  Radiology Dg Chest 2 View  Result Date: 12/28/2017 CLINICAL DATA:  Right upper back pain EXAM: CHEST - 2 VIEW COMPARISON:  07/29/2008 FINDINGS: Heart and mediastinal contours are within normal limits. No focal opacities or effusions. No acute bony abnormality. IMPRESSION: No active cardiopulmonary disease. Electronically Signed   By: Charlett Nose M.D.   On: 12/28/2017 18:29     Procedures Procedures (including critical care time)  Medications Ordered in ED Medications  acetaminophen (TYLENOL) tablet 1,000 mg (1,000 mg Oral Given 12/28/17 1801)  ketorolac (TORADOL) 15 MG/ML injection 15 mg (15 mg Intramuscular Given 12/28/17 1803)  oxyCODONE (Oxy IR/ROXICODONE) immediate release tablet 5 mg (5 mg Oral Given 12/28/17 1802)  diazepam (VALIUM) tablet 2 mg (2 mg Oral Given 12/28/17 1802)     Initial Impression / Assessment and Plan / ED Course  I have reviewed the triage vital signs and the nursing notes.  Pertinent labs & imaging results that were available during my care of the patient were reviewed by me and considered in my medical decision making (see chart for details).     42 yo F with muscular skeletal back pain based on history and physical.  She is requesting an x-ray because this been going on for so long.  I discussed with her  the limited utility of this but will obtain a chest x-ray to evaluate for occult mass or pneumonia.  We will give symptomatic treatment here.  Will try Voltaren gel to the patient's current regiment as well as Tylenol.  CXR negative for infiltrate or fx.  D/c home.   6:52 PM:  I have discussed the diagnosis/risks/treatment options with the patient and family and believe the pt to be eligible for discharge home to follow-up with PCP. We also discussed returning to the ED immediately if new or worsening sx occur. We discussed the sx which are most concerning (e.g., sudden worsening pain, fever, inability to tolerate by mouth) that necessitate immediate return. Medications administered to the patient during their visit and any new prescriptions provided to the patient are listed below.  Medications given during this visit Medications  acetaminophen (TYLENOL) tablet 1,000 mg (1,000 mg Oral Given 12/28/17 1801)  ketorolac (TORADOL) 15 MG/ML injection 15 mg (15 mg Intramuscular Given 12/28/17 1803)  oxyCODONE (Oxy IR/ROXICODONE)  immediate release tablet 5 mg (5 mg Oral Given 12/28/17 1802)  diazepam (VALIUM) tablet 2 mg (2 mg Oral Given 12/28/17 1802)      The patient appears reasonably screen and/or stabilized for discharge and I doubt any other medical condition or other [facility] requiring further screening, evaluation, or treatment in the ED at this time prior to discharge.   Final Clinical Impressions(s) / ED Diagnoses   Final diagnoses:  Thoracic back sprain, initial encounter    ED Discharge Orders         Ordered    diclofenac sodium (VOLTAREN) 1 % GEL  4 times daily     12/28/17 1845           Melene Plan, DO 12/28/17 1852

## 2017-12-28 NOTE — Discharge Instructions (Signed)
Take tylenol 1000mg (2 extra strength) four times a day.   Apply the gel as prescribed.   No heavy lifting >10lbs or bending at the waist, or reaching for objects.  Follow up with your PCP.  Ask if physical therapy may benefit you.

## 2017-12-28 NOTE — ED Notes (Signed)
Pt/family verbalized understanding of discharge instructions.   

## 2017-12-31 ENCOUNTER — Other Ambulatory Visit: Payer: Self-pay

## 2017-12-31 ENCOUNTER — Encounter (HOSPITAL_BASED_OUTPATIENT_CLINIC_OR_DEPARTMENT_OTHER): Payer: Self-pay | Admitting: Emergency Medicine

## 2017-12-31 ENCOUNTER — Emergency Department (HOSPITAL_BASED_OUTPATIENT_CLINIC_OR_DEPARTMENT_OTHER)
Admission: EM | Admit: 2017-12-31 | Discharge: 2017-12-31 | Discharge status: Against Medical Advice | Payer: BLUE CROSS/BLUE SHIELD | Attending: Emergency Medicine | Admitting: Emergency Medicine

## 2017-12-31 DIAGNOSIS — I10 Essential (primary) hypertension: Secondary | ICD-10-CM | POA: Insufficient documentation

## 2017-12-31 DIAGNOSIS — M546 Pain in thoracic spine: Secondary | ICD-10-CM | POA: Diagnosis not present

## 2017-12-31 LAB — URINALYSIS, ROUTINE W REFLEX MICROSCOPIC
Bilirubin Urine: NEGATIVE
Glucose, UA: NEGATIVE mg/dL
HGB URINE DIPSTICK: NEGATIVE
Ketones, ur: NEGATIVE mg/dL
LEUKOCYTES UA: NEGATIVE
NITRITE: NEGATIVE
PROTEIN: 100 mg/dL — AB
SPECIFIC GRAVITY, URINE: 1.02 (ref 1.005–1.030)
pH: 7.5 (ref 5.0–8.0)

## 2017-12-31 LAB — URINALYSIS, MICROSCOPIC (REFLEX)
RBC / HPF: NONE SEEN RBC/hpf (ref 0–5)
WBC, UA: NONE SEEN WBC/hpf (ref 0–5)

## 2017-12-31 LAB — PREGNANCY, URINE: Preg Test, Ur: NEGATIVE

## 2017-12-31 NOTE — ED Triage Notes (Signed)
Patient states that she is having right sided "back pain" - patient points to her right side and flank region. She states that she has had pain x 2 weeks  - patient denies any N/V, She saw her PCP yesterday and continues to have the pain  - she states that she has intermittent SOB because it hurts to take a deep breath

## 2017-12-31 NOTE — ED Notes (Addendum)
C/o rt scapula and rt flank pain x 2 weeks,  Increased pain when reaching back Has been treated w robaxin and dexamethasone without relief   Ambulatory without diff

## 2017-12-31 NOTE — ED Provider Notes (Signed)
[facility] EMERGENCY DEPARTMENT Provider Note   CSN: [id number] Arrival date & time: 12/31/17  1819     History   Chief Complaint No chief complaint on file.   HPI Melissa Norris is a 42 y.o. female presenting for the second time this week for right-sided mid back pain.  Patient states that pain has been constant for the past 2 weeks despite Voltaren, muscle relaxers, Toradol and chiropractic treatments.  Patient describes the pain as a constant throbbing to her mid right back that is moderate in intensity and worsened with back turning her body as well as with deep breaths.  Patient states that she believes the pain may have started after moving a lot of boxes earlier this month but cannot recall a particular injury or moment where the pain began.  Patient with negative two-view chest x-ray on 12/28/2017.  Patient denies history of hemoptysis, recent surgery/trauma, history of blood clots or hormone use.  Patient denies history of cancer, recent surgeries, leg swelling, leg tenderness, recent immobilization.  Patient denies history of fever, IV drug use or trauma.  HPI  Past Medical History:  Diagnosis Date  . Hypertension     Patient Active Problem List   Diagnosis Date Noted  . Hand paresthesia 03/03/2015  . Body mass index (BMI) of 50-59.9 in adult (HCC) 07/19/2013  . Allergic rhinitis 07/19/2013  . Obstructive apnea 07/19/2013  . Avitaminosis D 01/31/2013  . Essential (primary) hypertension 01/30/2013  . Cardiac disease 01/15/2013    Past Surgical History:  Procedure Laterality Date  . TUBAL LIGATION       OB History   None      Home Medications    Prior to Admission medications   Medication Sig Start Date End Date Taking? Authorizing Provider  amLODipine (NORVASC) 5 MG tablet  10/28/17   [provider]  baclofen (LIORESAL) 10 MG tablet Take 1 tablet (10 mg total) by mouth 3 (three) times daily. 12/20/16   Everlene Farrier, PA-C    carvedilol (COREG) 25 MG tablet  12/14/13   [provider]  chlorpheniramine-HYDROcodone (TUSSIONEX) 10-8 MG/5ML SUER  10/15/17   [provider]  Cyanocobalamin (VITAMIN B-12 PO) Take 1 tablet by mouth daily.    [provider]  diclofenac sodium (VOLTAREN) 1 % GEL Apply 4 g topically 4 (four) times daily. 12/28/17   Melene Plan, DO  ergocalciferol (VITAMIN D2) 50000 UNITS capsule  01/08/15   [provider]  hydrochlorothiazide (HYDRODIURIL) 25 MG tablet  02/12/15   [provider]  meloxicam (MOBIC) 15 MG tablet Take 1 tablet (15 mg total) by mouth daily. 11/03/17   Hyatt, Max T, DPM  naproxen (NAPROSYN) 375 MG tablet Take 1 tablet (375 mg total) by mouth 2 (two) times daily with a meal. 12/20/16   Everlene Farrier, PA-C  neomycin-polymyxin b-dexamethasone (MAXITROL) 3.5-10000-0.1 SUSP  10/15/17   [provider]  Vitamin D, Ergocalciferol, (DRISDOL) 50000 UNITS CAPS capsule Take by mouth. 01/31/13   [provider]    Family History History reviewed. No pertinent family history.  Social History Social History   Tobacco Use  . Smoking status: Never Smoker  . Smokeless tobacco: Never Used  Substance Use Topics  . Alcohol use: No    Alcohol/week: 0.0 standard drinks  . Drug use: No     Allergies   Patient has no known allergies.   Review of Systems Review of Systems  Constitutional: Negative.  Negative for chills, fatigue and fever.  Respiratory: Negative.  Negative for cough and shortness of breath.   Cardiovascular: Negative.  Negative for chest pain and leg swelling.  Genitourinary: Negative.  Negative for dysuria and hematuria.  Musculoskeletal: Positive for back pain. Negative for neck pain.  Skin: Negative.  Negative for wound.  Neurological: Negative.  Negative for weakness and numbness.       Denies saddle area paresthesias Denies bowel or bladder incontinence     Physical Exam Updated Vital Signs BP (!)  150/90 (BP Location: Left Arm)   Pulse 95   Temp 99.1 F (37.3 C)   Resp 18   Ht 5\' 1"  (1.549 m)   Wt 127 kg   LMP 12/08/2017   SpO2 100%   BMI 52.90 kg/m   Physical Exam  Constitutional: She is oriented to person, place, and time. She appears well-developed and well-nourished. No distress.  HENT:  Head: Normocephalic and atraumatic.  Right Ear: External ear normal.  Left Ear: External ear normal.  Nose: Nose normal.  Eyes: Pupils are equal, round, and reactive to light. EOM are normal.  Neck: Trachea normal and normal range of motion. Neck supple. No tracheal deviation present.  Cardiovascular: Normal rate, regular rhythm, normal heart sounds and intact distal pulses.  Pulmonary/Chest: Effort normal and breath sounds normal. No respiratory distress. She has no decreased breath sounds.  Abdominal: Soft. There is no tenderness. There is no rebound and no guarding.  Musculoskeletal: Normal range of motion. She exhibits no deformity.       Cervical back: Normal.       Thoracic back: She exhibits tenderness. She exhibits normal range of motion, no bony tenderness, no swelling, no edema and no deformity.       Lumbar back: Normal.       Back:       Right lower leg: Normal.       Left lower leg: Normal.  No midline spinal tenderness to palpation.  No crepitus deformity or step-off noted.  Patient with mild right-sided thoracic paraspinal muscular tenderness.  Neurological: She is alert and oriented to person, place, and time. She has normal strength. No sensory deficit. GCS eye subscore is 4. GCS verbal subscore is 5. GCS motor subscore is 6.  Speech is clear and goal oriented, follows commands Major Cranial nerves without deficit, no facial droop Normal strength in upper and lower extremities bilaterally including dorsiflexion and plantar flexion, strong and equal grip strength Sensation normal to light and sharp touch Moves extremities without ataxia, coordination intact Normal  gait  Skin: Skin is warm and dry.  Psychiatric: She has a normal mood and affect. Her behavior is normal.     ED Treatments / Results  Labs (all labs ordered are listed, but only abnormal results are displayed) Labs Reviewed  URINALYSIS, ROUTINE W REFLEX MICROSCOPIC - Abnormal; Notable for the following components:      Result Value   Protein, ur 100 (*)    All other components within normal limits  URINALYSIS, MICROSCOPIC (REFLEX) - Abnormal; Notable for the following components:   Bacteria, UA RARE (*)    All other components within normal limits  PREGNANCY, URINE    EKG None  Radiology No results found.  Procedures Procedures (including critical care time)  Medications Ordered in ED Medications - No data to display   Initial Impression / Assessment and Plan / ED Course  I have reviewed the triage vital signs and the nursing notes.  Pertinent labs & imaging results that  were available during my care of the patient were reviewed by me and considered in my medical decision making (see chart for details).  Clinical Course as of Dec 31 2357  Sat Dec 31, 2017  2333 I have been informed by nursing staff that patient has left the department without informing anyone.   [BM]    Clinical Course User Index [BM] Bill Salinas, PA-C   Patient presenting with right sided thoracic back pain.  No neurological deficits and normal neuro exam.  Patient can walk but states is painful.  Patient denies loss of bowel/bladder control or saddle area paresthesias.  No concern for cauda equina.  No fever, night sweats, weight loss, h/o cancer, or IVDU. RICE protocol and pain medicine indicated and discussed with patient.   Pain is worsened with palpation of the right paraspinal thoracic musculature and has been present and consistent for the past 2 weeks.  Patient is not tachycardic, saturations of 100% on room air.  Patient is low risk Wells and PERC negative.  Doubt pulmonary is  embolism at this time.   Patient case discussed with Dr. Rush Landmark who agrees with plan of care.    Urinalysis shows protein, patient states that her primary care provider is aware.  Prior to discharge patient was afebrile, not tachycardic, not hypotensive, well-appearing and in no acute distress. Prior to discharge patient left without informing nursing staff.  Upon my reevaluation of patient bed was empty.  At this time there does not appear to be any evidence of an acute emergency medical condition and the patient appears stable for discharge with appropriate outpatient follow up.   Patient's case discussed with Dr. Rush Landmark who agrees with plan to discharge with follow-up.    Note: Portions of this report may have been transcribed using voice recognition software. Every effort was made to ensure accuracy; however, inadvertent computerized transcription errors may still be present.  Final Clinical Impressions(s) / ED Diagnoses   Final diagnoses:  Right-sided thoracic back pain, unspecified chronicity    ED Discharge Orders    None       Elizabeth Palau 01/01/18 0004    Tegeler, Canary Brim, MD 01/01/18 0020

## 2018-01-05 ENCOUNTER — Ambulatory Visit: Payer: BLUE CROSS/BLUE SHIELD | Admitting: Podiatry

## 2018-03-09 ENCOUNTER — Ambulatory Visit: Payer: BLUE CROSS/BLUE SHIELD | Admitting: Podiatry

## 2018-03-09 ENCOUNTER — Encounter: Payer: Self-pay | Admitting: Podiatry

## 2018-03-09 DIAGNOSIS — M76822 Posterior tibial tendinitis, left leg: Secondary | ICD-10-CM

## 2018-03-09 NOTE — Progress Notes (Signed)
She presents today stating that she is doing much better with the use of the orthotics.  She states that when she uses the orthotics she has no medial ankle pain.  Objective: Vital signs are stable alert and oriented x3.  Pulses are palpable.  Neurologic sensorium is intact.  Degenerative flexors are intact.  Still has some tenderness on palpation posterior tibial tendon.  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  Plan: Continue use of the orthotics will follow-up with me on as-needed basis.

## 2018-04-20 ENCOUNTER — Ambulatory Visit: Payer: BLUE CROSS/BLUE SHIELD | Admitting: Podiatry

## 2018-06-06 ENCOUNTER — Ambulatory Visit: Payer: BLUE CROSS/BLUE SHIELD | Admitting: Podiatry

## 2020-01-15 IMAGING — DX DG CHEST 2V
2 series · 2 of 2 positions shown · non-contrast
Comparison: 07/29/2008

CLINICAL DATA: Right upper back pain

EXAM:
CHEST - 2 VIEW

[chest pa]
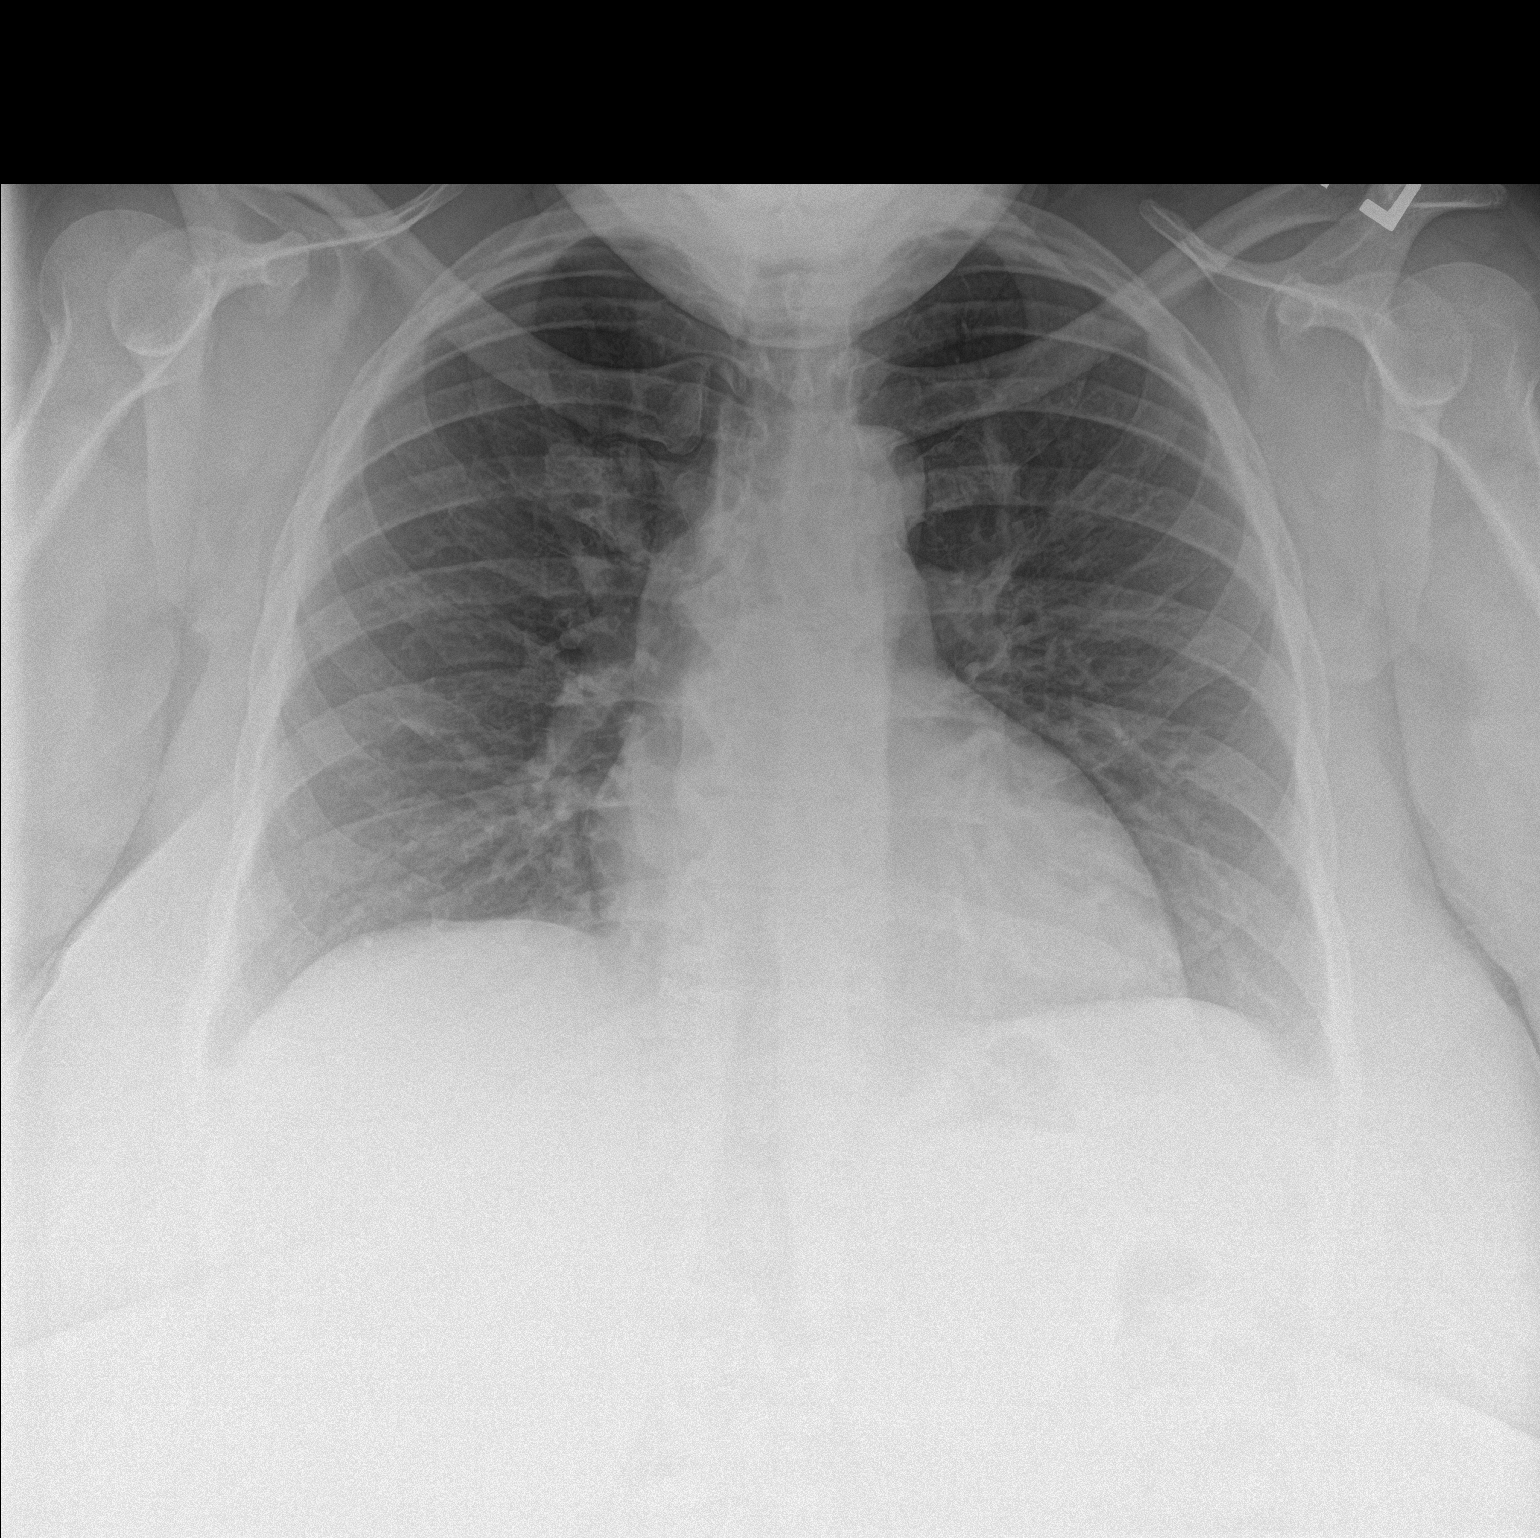

[chest lat]
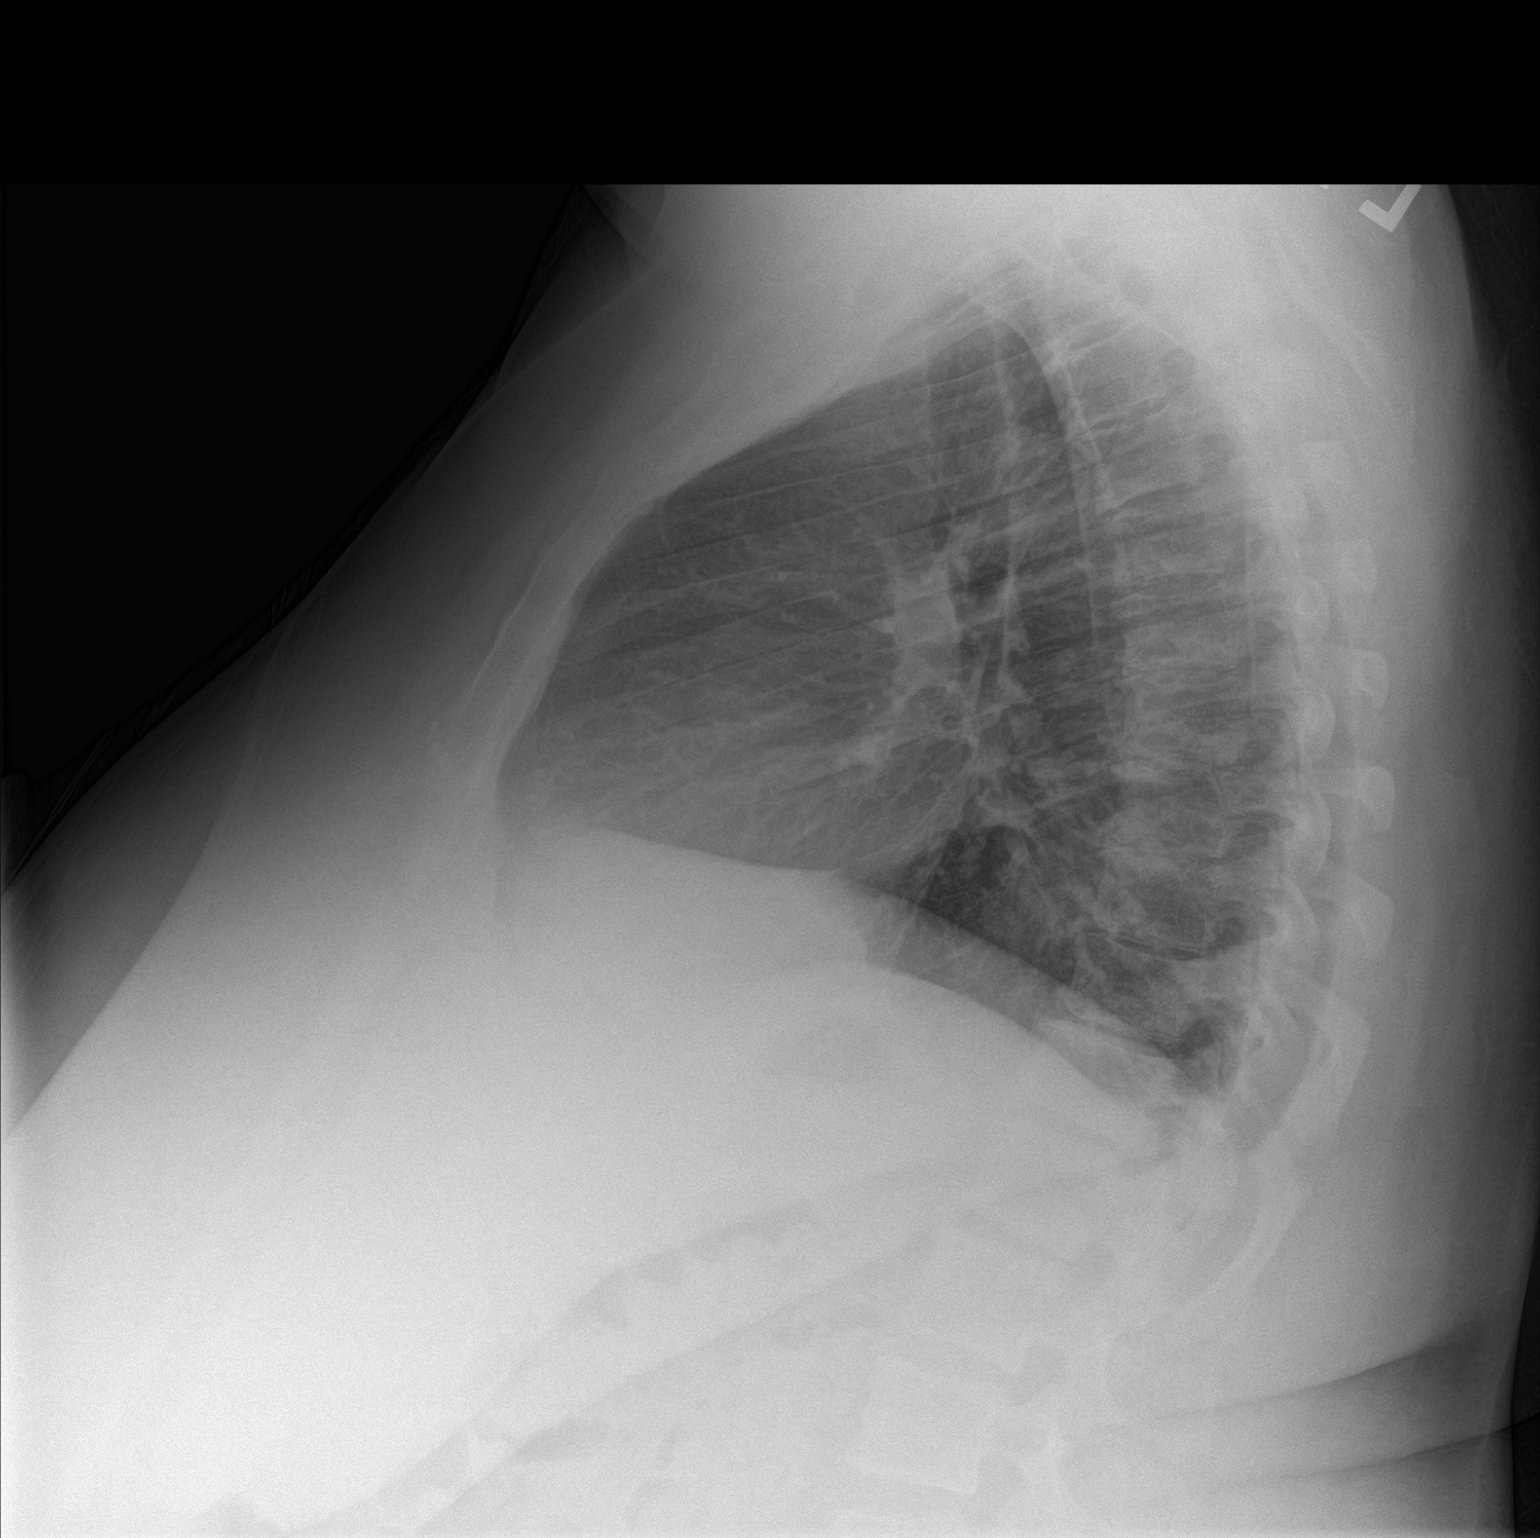

[2 of 2 positions shown; findings below may reference images not displayed]

FINDINGS: Heart and mediastinal contours are within normal limits. No focal
opacities or effusions. No acute bony abnormality.
IMPRESSION: No active cardiopulmonary disease.

## 2020-09-04 ENCOUNTER — Ambulatory Visit: Payer: BLUE CROSS/BLUE SHIELD | Admitting: Podiatry

## 2020-09-18 ENCOUNTER — Ambulatory Visit: Payer: BLUE CROSS/BLUE SHIELD | Admitting: Podiatry

## 2020-09-18 ENCOUNTER — Other Ambulatory Visit: Payer: Self-pay

## 2020-09-18 ENCOUNTER — Ambulatory Visit: Payer: 59

## 2020-09-18 DIAGNOSIS — M778 Other enthesopathies, not elsewhere classified: Secondary | ICD-10-CM | POA: Diagnosis not present

## 2020-09-18 DIAGNOSIS — L600 Ingrowing nail: Secondary | ICD-10-CM

## 2020-09-18 MED ORDER — NEOMYCIN-POLYMYXIN-HC 1 % OT SOLN: OTIC | 1 refills | Status: DC

## 2020-09-18 MED ORDER — MELOXICAM 15 MG PO TABS: 15.0000 mg | ORAL_TABLET | Freq: Every day | ORAL | 3 refills | Status: DC

## 2020-09-18 NOTE — Patient Instructions (Signed)

## 2020-09-20 NOTE — Progress Notes (Signed)
She presents today states that she seems to be doing pretty well.  She think she has an ingrown toenail to the hallux right.  States that she would like to have a new set of orthotics.  And another prescription for meloxicam.  Objective: Vital signs are stable she is alert and oriented x3.  Pulses are palpable.  Neurologic sensorium is intact Deetjen reflexes are intact muscle strength normal symmetrical.  Sharply abraded nail margin along the tibial border of the hallux right.  Assessment: Ingrown nail tibial border hallux right.  Plan: Performed chemical matricectomy today, she tolerated this well after local anesthetic was administered.  She was given both oral and home-going structure for the care and soaking of the toe as well as a prescription for Cortisporin Otic to be applied twice daily after soaking.  We will see about getting her another set of orthotics ordered or second pair at the very least.  I will also go ahead and refill her meloxicam.  We will follow-up with her in about 2 weeks.

## 2020-10-02 ENCOUNTER — Ambulatory Visit: Payer: 59 | Admitting: Podiatry

## 2020-10-02 ENCOUNTER — Other Ambulatory Visit: Payer: Self-pay

## 2020-10-02 ENCOUNTER — Encounter: Payer: Self-pay | Admitting: Podiatry

## 2020-10-02 DIAGNOSIS — Z9889 Other specified postprocedural states: Secondary | ICD-10-CM

## 2020-10-02 DIAGNOSIS — L600 Ingrowing nail: Secondary | ICD-10-CM

## 2020-10-02 NOTE — Progress Notes (Signed)
She presents today for follow-up of her nail procedure hallux right states that there is a tiny CAT scan here if we can get that off there but it feels great otherwise.  Objective: Vital signs are stable she alert oriented x3 matrixectomy hallux bilateral demonstrates no erythema edema cellulitis drainage or odor to small piece of skin.  Assessment: Well-healing surgical toes.  Plan: I removed a small piece of skin no iatrogenic lesions follow-up with me on an as-needed basis.

## 2021-09-10 ENCOUNTER — Ambulatory Visit (INDEPENDENT_AMBULATORY_CARE_PROVIDER_SITE_OTHER): Payer: 59 | Admitting: Podiatry

## 2021-09-10 ENCOUNTER — Encounter: Payer: Self-pay | Admitting: Podiatry

## 2021-09-10 DIAGNOSIS — M76821 Posterior tibial tendinitis, right leg: Secondary | ICD-10-CM | POA: Diagnosis not present

## 2021-09-10 DIAGNOSIS — M722 Plantar fascial fibromatosis: Secondary | ICD-10-CM

## 2021-09-10 DIAGNOSIS — M76822 Posterior tibial tendinitis, left leg: Secondary | ICD-10-CM | POA: Diagnosis not present

## 2021-09-10 MED ORDER — MELOXICAM 15 MG PO TABS: 15.0000 mg | ORAL_TABLET | Freq: Every day | ORAL | 3 refills | Status: DC

## 2021-09-10 MED ORDER — TRIAMCINOLONE ACETONIDE 40 MG/ML IJ SUSP
40.0000 mg | Freq: Once | INTRAMUSCULAR | Status: AC
  Administered 2021-09-10: 40 mg

## 2021-09-10 NOTE — Progress Notes (Signed)
She presents today states that she has had a flareup of her Planter fasciitis bilaterally she states that the swelling is painful and she states that the lateral ankle sometimes hurts due to the multiple sprains over the years.  Objective: Vital signs stable alert oriented x3 pulses are palpable.  She has pain on palpation medial calcaneal tubercles bilateral.  No reproducible pain in the ankles.  Assessment: Plantar fasciitis bilateral.  Plan: I injected the bilateral heels today did not put her on any steroids due to her diabetes nonsteroidals were not prescribed.

## 2021-10-05 ENCOUNTER — Encounter: Payer: Self-pay | Admitting: Emergency Medicine

## 2021-10-05 ENCOUNTER — Ambulatory Visit
Admission: EM | Admit: 2021-10-05 | Discharge: 2021-10-05 | Disposition: A | Discharge status: Home / Self Care | Payer: 59 | Attending: Urgent Care | Admitting: Urgent Care

## 2021-10-05 DIAGNOSIS — R030 Elevated blood-pressure reading, without diagnosis of hypertension: Secondary | ICD-10-CM | POA: Diagnosis not present

## 2021-10-05 DIAGNOSIS — L255 Unspecified contact dermatitis due to plants, except food: Secondary | ICD-10-CM | POA: Diagnosis not present

## 2021-10-05 DIAGNOSIS — I10 Essential (primary) hypertension: Secondary | ICD-10-CM

## 2021-10-05 DIAGNOSIS — L299 Pruritus, unspecified: Secondary | ICD-10-CM

## 2021-10-05 MED ORDER — HYDROXYZINE HCL 25 MG PO TABS: 12.5000 mg | ORAL_TABLET | Freq: Three times a day (TID) | ORAL | 0 refills | Status: DC | PRN

## 2021-10-05 MED ORDER — PREDNISONE 20 MG PO TABS: ORAL_TABLET | ORAL | 0 refills | Status: DC

## 2021-10-05 NOTE — ED Provider Notes (Signed)
[facility]   MRN: [id number] DOB: January 09, 1976  Subjective:   Melissa Norris is a 46 y.o. female presenting for 4-day history of acute onset persistent worsening itchy rash over the chest, neck and face.  Patient was doing a lot of yard work and unfortunately had contact with poison ivy.  No pain, blisters or drainage.  No oral or facial swelling, shortness of breath.  No current facility-administered medications for this encounter.  Current Outpatient Medications:    amLODipine (NORVASC) 5 MG tablet, , Disp: , Rfl: 4   baclofen (LIORESAL) 10 MG tablet, Take 1 tablet (10 mg total) by mouth 3 (three) times daily., Disp: 30 each, Rfl: 0   carvedilol (COREG) 25 MG tablet, , Disp: , Rfl: 0   Cyanocobalamin (VITAMIN B-12 PO), Take 1 tablet by mouth daily., Disp: , Rfl:    diclofenac sodium (VOLTAREN) 1 % GEL, Apply 4 g topically 4 (four) times daily., Disp: 100 g, Rfl: 0   ergocalciferol (VITAMIN D2) 50000 UNITS capsule, , Disp: , Rfl:    hydrochlorothiazide (HYDRODIURIL) 25 MG tablet, , Disp: , Rfl:    meloxicam (MOBIC) 15 MG tablet, Take 1 tablet (15 mg total) by mouth daily., Disp: 90 tablet, Rfl: 3   methocarbamol (ROBAXIN) 750 MG tablet, , Disp: , Rfl: 0   NEOMYCIN-POLYMYXIN-HYDROCORTISONE (CORTISPORIN) 1 % SOLN OTIC solution, Apply 1-2 drops to toe BID after soaking, Disp: 10 mL, Rfl: 1   No Known Allergies  Past Medical History:  Diagnosis Date   Hypertension      Past Surgical History:  Procedure Laterality Date   TUBAL LIGATION      History reviewed. No pertinent family history.  Social History   Tobacco Use   Smoking status: Never   Smokeless tobacco: Never  Substance Use Topics   Alcohol use: No    Alcohol/week: 0.0 standard drinks of alcohol   Drug use: No    ROS   Objective:   Vitals: BP (!) 163/91   Pulse 75   Temp 98.6 F (37 C)   Resp 20   SpO2 98%   BP Readings from Last 3 Encounters:  10/05/21 (!) 163/91  12/31/17  (!) 150/90  12/28/17 135/73   Physical Exam Constitutional:      General: She is not in acute distress.    Appearance: Normal appearance. She is well-developed and normal weight. She is not ill-appearing, toxic-appearing or diaphoretic.  HENT:     Head: Normocephalic and atraumatic.     Right Ear: Tympanic membrane, ear canal and external ear normal. No drainage or tenderness. No middle ear effusion. There is no impacted cerumen. Tympanic membrane is not erythematous.     Left Ear: Tympanic membrane, ear canal and external ear normal. No drainage or tenderness.  No middle ear effusion. There is no impacted cerumen. Tympanic membrane is not erythematous.     Nose: Nose normal. No congestion or rhinorrhea.     Mouth/Throat:     Mouth: Mucous membranes are moist. No oral lesions.     Pharynx: No pharyngeal swelling, oropharyngeal exudate, posterior oropharyngeal erythema or uvula swelling.     Tonsils: No tonsillar exudate or tonsillar abscesses.  Eyes:     General: No scleral icterus.       Right eye: No discharge.        Left eye: No discharge.     Extraocular Movements: Extraocular movements intact.     Right eye: Normal extraocular motion.  Left eye: Normal extraocular motion.     Conjunctiva/sclera: Conjunctivae normal.  Cardiovascular:     Rate and Rhythm: Normal rate.  Pulmonary:     Effort: Pulmonary effort is normal.  Musculoskeletal:     Cervical back: Normal range of motion and neck supple.  Lymphadenopathy:     Cervical: No cervical adenopathy.  Skin:    General: Skin is warm and dry.     Findings: Rash (clusters of macular papular lesions, some linear distribution over left side of neck, right side of face and to a lesser degree over the wrist worse over the left) present.  Neurological:     General: No focal deficit present.     Mental Status: She is alert and oriented to person, place, and time.     Cranial Nerves: No cranial nerve deficit.     Motor: No  weakness.     Coordination: Coordination normal.     Gait: Gait normal.  Psychiatric:        Mood and Affect: Mood normal.        Behavior: Behavior normal.     Assessment and Plan :   PDMP not reviewed this encounter.  1. Rhus dermatitis   2. Itching    Recommended 2 weeks of prednisone.  We will use a lower dose given her blood pressure.  Recommended for the time being she increase her amlodipine to 10 mg daily.  Use hydroxyzine for itching. No signs of an acute encephalopathy, stroke. Counseled patient on potential for adverse effects with medications prescribed/recommended today, ER and return-to-clinic precautions discussed, patient verbalized understanding.     Wallis Bamberg, [place] 10/05/21 [phone]

## 2021-10-05 NOTE — Discharge Instructions (Addendum)
Make sure you are eating foods low in sodium/salt. Double up on your blood pressure medicine, amlodipine, for the next 2 weeks. That should be a 10mg  daily dose.

## 2021-10-05 NOTE — ED Triage Notes (Signed)
Pt here with poison ivy on left chest, left arm and chin x 4 days. It is itchy and spreading.

## 2021-11-23 ENCOUNTER — Other Ambulatory Visit (HOSPITAL_COMMUNITY): Payer: Self-pay | Admitting: *Deleted

## 2021-11-23 DIAGNOSIS — I10 Essential (primary) hypertension: Secondary | ICD-10-CM

## 2021-11-23 NOTE — Progress Notes (Signed)
Pt's daughter seen by Dr Gala Romney in [facility], pt request to be seen by cards, per Dr Gala Romney ref placed to gen cards as pt does not have HF

## 2021-11-30 ENCOUNTER — Ambulatory Visit (HOSPITAL_COMMUNITY): Payer: Commercial Managed Care - HMO | Admitting: Cardiology

## 2022-01-03 NOTE — Progress Notes (Unsigned)
Cardiology Office Note:    Date:  01/04/2022   ID:  Melissa Norris, DOB September 17, 1975, MRN [id number]  PCP:  Ansel Bong, PA   [facility] Providers Cardiologist:  Christell Constant, MD     Referring MD: Ansel Bong, PA   CC: HTN Consulted for the evaluation of HTN at the behest of Ms. Aydt  History of Present Illness:    Melissa Norris is a 46 y.o. female with a hx of HTN, FHX of HF, OSA, And Super morbid obesity who presents for evaluation.  Had a history of peri-partum cardiomyopathy.  Patient notes that she is feeling fine.   Able to garden, married (I am seeing her husband). 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 She bike rides: goes along trails.    Has had no chest pain, chest pressure, chest tightness, chest stinging No shortness of breath, DOE .  No PND or orthopnea.  No weight gain, leg swelling , or abdominal swelling.  No syncope or near syncope . Notes  no palpitations or funny heart beats.      Past Medical History:  Diagnosis Date   Hypertension    Ingrown right big toenail    Plantar fasciitis    Posterior tibialis tendinitis of both lower extremities    Rhus dermatitis     Past Surgical History:  Procedure Laterality Date   TUBAL LIGATION      Current Medications: Current Meds  Medication Sig   amLODipine (NORVASC) 5 MG tablet    carvedilol (COREG) 25 MG tablet    Cyanocobalamin (VITAMIN B-12 PO) Take 1 tablet by mouth daily.   ergocalciferol (VITAMIN D2) 50000 UNITS capsule    meloxicam (MOBIC) 15 MG tablet Take 1 tablet (15 mg total) by mouth daily. (Patient taking differently: Take 15 mg by mouth as needed for pain.)     Allergies:   Patient has no known allergies.   Social History   Socioeconomic History   Marital status: Married    Spouse name: Not on file   Number of children: Not on file   Years of education: Not on file   Highest education level: Not on file  Occupational History    Not on file  Tobacco Use   Smoking status: Never   Smokeless tobacco: Never  Substance and Sexual Activity   Alcohol use: No    Alcohol/week: 0.0 standard drinks of alcohol   Drug use: No   Sexual activity: Not on file  Other Topics Concern   Not on file  Social History Narrative   Not on file   Social Determinants of Health   Financial Resource Strain: Not on file  Food Insecurity: Not on file  Transportation Needs: Not on file  Physical Activity: Not on file  Stress: Not on file  Social Connections: Not on file    Social: I see her with her husband  Family History: Daughter has heart failure with reduced ejection fraction unclear etiology.  ROS:   Please see the history of present illness.    All other systems reviewed and are negative.  EKGs/Labs/Other Studies Reviewed:    The following studies were reviewed today:  EKG:  EKG is  ordered today.  The ekg ordered today demonstrates  01/03/22: SR rate 83   Transthoracic Echocardiogram: Date:  Results: Normal left ventricular size and systolic function with no appreciable  segmental abnormality.  Ejection fraction is visually estimated at 55-60%.  Normal size left  atrium.  No significant valve disease seen.    Recent Labs: No results found for requested labs within last 365 days.  Recent Lipid Panel No results found for: "CHOL", "TRIG", "HDL", "CHOLHDL", "VLDL", "LDLCALC", "LDLDIRECT"          Physical Exam:    VS:  BP 122/80   Pulse 83   Ht 5' 1.5" (1.562 m)   Wt 293 lb (132.9 kg)   SpO2 99%   BMI 54.47 kg/m     Wt Readings from Last 3 Encounters:  01/04/22 293 lb (132.9 kg)  12/31/17 279 lb 15.8 oz (127 kg)  12/28/17 280 lb (127 kg)    Gen: No distress, Morbid Obesity  Neck: No JVD Cardiac: No Rubs or Gallops, no murmur, RRR +2 radial pulses Respiratory: Clear to auscultation bilaterally, normal effort, normal  respiratory rate GI: Soft, nontender, non-distended  MS: No  edema;  moves all  extremities Integument: Skin feels warm Neuro:  At time of evaluation, alert and oriented to person/place/time/situation  Psych: Normal affect, patient feels well  ASSESSMENT:    1. Essential (primary) hypertension   2. Morbid obesity ([place])   3. Obstructive apnea   4. Family history of heart failure     PLAN:    Essential HTN Morbid Obesity Fhx of HF (daughter) with normal Echo at [facility] - Continue norvasc 5 mg and coreg 25 mg - we have discussed diet changes, exercise changes, and health weight loss interventions at length - we have offered her sleet study and referral, she is contemplative - ambulatory BP monitoring   Six months me or APP        Medication Adjustments/Labs and Tests Ordered: Current medicines are reviewed at length with the patient today.  Concerns regarding medicines are outlined above.  Orders Placed This Encounter  Procedures   EKG 12-Lead   No orders of the defined types were placed in this encounter.   Patient Instructions  Medication Instructions:  Your physician recommends that you continue on your current medications as directed. Please refer to the Current Medication list given to you today.  *If you need a refill on your cardiac medications before your next appointment, please call your pharmacy*   Lab Work: NONE If you have labs (blood work) drawn today and your tests are completely normal, you will receive your results only by: [place] (if you have MyChart) OR A paper copy in the mail If you have any lab test that is abnormal or we need to change your treatment, we will call you to review the results.   Testing/Procedures: NONE   Follow-Up: At [facility], you and your health needs are our priority.  As part of our continuing mission to provide you with exceptional heart care, we have created designated Provider Care Teams.  These Care Teams include your primary Cardiologist (physician) and Advanced Practice  Providers (APPs -  Physician Assistants and Nurse Practitioners) who all work together to provide you with the care you need, when you need it.  We recommend signing up for the patient portal called "MyChart".  Sign up information is provided on this After Visit Summary.  MyChart is used to connect with patients for Virtual Visits (Telemedicine).  Patients are able to view lab/test results, encounter notes, upcoming appointments, etc.  Non-urgent messages can be sent to your provider as well.   To learn more about what you can do with MyChart, go to [email or web address].    Your next appointment:  6 month(s)  The format for your next appointment:   In Person  Provider:   Christell Constant, MD     Important Information About Sugar         Signed, Christell Constant, MD  01/04/2022 2:45 PM    [facility]

## 2022-01-04 ENCOUNTER — Ambulatory Visit: Payer: Commercial Managed Care - HMO | Attending: Internal Medicine | Admitting: Internal Medicine

## 2022-01-04 ENCOUNTER — Encounter: Payer: Self-pay | Admitting: Internal Medicine

## 2022-01-04 VITALS — BP 122/80 | HR 83 | Ht 61.5 in | Wt 293.0 lb

## 2022-01-04 DIAGNOSIS — G4733 Obstructive sleep apnea (adult) (pediatric): Secondary | ICD-10-CM | POA: Diagnosis not present

## 2022-01-04 DIAGNOSIS — I10 Essential (primary) hypertension: Secondary | ICD-10-CM

## 2022-01-04 DIAGNOSIS — Z8249 Family history of ischemic heart disease and other diseases of the circulatory system: Secondary | ICD-10-CM

## 2022-01-04 NOTE — Patient Instructions (Signed)
Medication Instructions:  Your physician recommends that you continue on your current medications as directed. Please refer to the Current Medication list given to you today.  *If you need a refill on your cardiac medications before your next appointment, please call your pharmacy*   Lab Work: NONE If you have labs (blood work) drawn today and your tests are completely normal, you will receive your results only by: MyChart Message (if you have MyChart) OR A paper copy in the mail If you have any lab test that is abnormal or we need to change your treatment, we will call you to review the results.   Testing/Procedures: NONE   Follow-Up: At [facility], you and your health needs are our priority.  As part of our continuing mission to provide you with exceptional heart care, we have created designated Provider Care Teams.  These Care Teams include your primary Cardiologist (physician) and Advanced Practice Providers (APPs -  Physician Assistants and Nurse Practitioners) who all work together to provide you with the care you need, when you need it.  We recommend signing up for the patient portal called "MyChart".  Sign up information is provided on this After Visit Summary.  MyChart is used to connect with patients for Virtual Visits (Telemedicine).  Patients are able to view lab/test results, encounter notes, upcoming appointments, etc.  Non-urgent messages can be sent to your provider as well.   To learn more about what you can do with MyChart, go to [email or web address].    Your next appointment:   6 month(s)  The format for your next appointment:   In Person  Provider:   Mahesh A Chandrasekhar, MD      Important Information About Sugar       

## 2022-01-12 ENCOUNTER — Telehealth: Payer: Self-pay

## 2022-01-12 MED ORDER — AMLODIPINE BESYLATE 5 MG PO TABS: 5.0000 mg | ORAL_TABLET | Freq: Every day | ORAL | 3 refills | Status: DC

## 2022-01-12 MED ORDER — CARVEDILOL 25 MG PO TABS: 25.0000 mg | ORAL_TABLET | Freq: Two times a day (BID) | ORAL | 3 refills | Status: DC

## 2022-01-12 NOTE — Telephone Encounter (Signed)
Pt requesting refills on carvedilol and amlodipine.  Sent to [facility] on [address] per pt request.   No further concerns voiced.

## 2022-01-22 ENCOUNTER — Ambulatory Visit
Admission: EM | Admit: 2022-01-22 | Discharge: 2022-01-22 | Disposition: A | Discharge status: Home / Self Care | Payer: Commercial Managed Care - HMO | Attending: Emergency Medicine | Admitting: Emergency Medicine

## 2022-01-22 ENCOUNTER — Ambulatory Visit (INDEPENDENT_AMBULATORY_CARE_PROVIDER_SITE_OTHER): Payer: Commercial Managed Care - HMO

## 2022-01-22 DIAGNOSIS — J329 Chronic sinusitis, unspecified: Secondary | ICD-10-CM | POA: Diagnosis not present

## 2022-01-22 DIAGNOSIS — J988 Other specified respiratory disorders: Secondary | ICD-10-CM

## 2022-01-22 DIAGNOSIS — R059 Cough, unspecified: Secondary | ICD-10-CM | POA: Diagnosis not present

## 2022-01-22 MED ORDER — CETIRIZINE HCL 10 MG PO TABS: 10.0000 mg | ORAL_TABLET | Freq: Every day | ORAL | 1 refills | Status: AC

## 2022-01-22 MED ORDER — AZITHROMYCIN 250 MG PO TABS: ORAL_TABLET | ORAL | 0 refills | Status: AC

## 2022-01-22 MED ORDER — IPRATROPIUM BROMIDE 0.06 % NA SOLN: 2.0000 | Freq: Three times a day (TID) | NASAL | 1 refills | Status: AC

## 2022-01-22 MED ORDER — PROMETHAZINE-DM 6.25-15 MG/5ML PO SYRP: 5.0000 mL | ORAL_SOLUTION | Freq: Four times a day (QID) | ORAL | 0 refills | Status: DC | PRN

## 2022-01-22 NOTE — ED Provider Notes (Signed)
[facility]    CSN: [id number] Arrival date & time: 01/22/22  1743    HISTORY   Chief Complaint  Patient presents with   Cough   Wheezing   HPI Melissa Norris is a pleasant, 46 y.o. female who presents to urgent care today. Pt c/o cough and wheezing which worse when she is lying down.  Patient states this has been going on for the past week.  Patient states she has been taking Mucinex, Robitussin, drinking hot tea and taking over-the-counter cold and flu preparations without meaningful relief of her symptoms.  Patient has elevated blood pressure on arrival today.  Per EMR, patient is a history of essential hypertension and obstructive apnea.  Patient denies history of allergies and asthma.  The history is provided by the patient.   Past Medical History:  Diagnosis Date   Hypertension    Ingrown right big toenail    Plantar fasciitis    Posterior tibialis tendinitis of both lower extremities    Rhus dermatitis    Patient Active Problem List   Diagnosis Date Noted   Morbid obesity ([place]) 01/04/2022   Family history of heart failure 01/04/2022   Hand paresthesia 03/03/2015   Body mass index (BMI) of 50-59.9 in adult [facility]) 07/19/2013   Allergic rhinitis 07/19/2013   Obstructive apnea 07/19/2013   Avitaminosis D 01/31/2013   Essential (primary) hypertension 01/30/2013   Cardiac disease 01/15/2013   Past Surgical History:  Procedure Laterality Date   TUBAL LIGATION     OB History   No obstetric history on file.    Home Medications    Prior to Admission medications   Medication Sig Start Date End Date Taking? Authorizing Provider  amLODipine (NORVASC) 5 MG tablet Take 1 tablet (5 mg total) by mouth daily. 01/12/22   Werner Lean, MD  carvedilol (COREG) 25 MG tablet Take 1 tablet (25 mg total) by mouth 2 (two) times daily with a meal. 01/12/22   Chandrasekhar, Mahesh A, MD  Cyanocobalamin (VITAMIN B-12 PO) Take 1 tablet by mouth daily.     [provider]  ergocalciferol (VITAMIN D2) 50000 UNITS capsule  01/08/15   [provider]  meloxicam (MOBIC) 15 MG tablet Take 1 tablet (15 mg total) by mouth daily. Patient taking differently: Take 15 mg by mouth as needed for pain. 09/10/21   Hyatt, Romilda Garret, DPM    Family History History reviewed. No pertinent family history. Social History Social History   Tobacco Use   Smoking status: Never   Smokeless tobacco: Never  Substance Use Topics   Alcohol use: No    Alcohol/week: 0.0 standard drinks of alcohol   Drug use: No   Allergies   Patient has no known allergies.  Review of Systems Review of Systems Pertinent findings revealed after performing a 14 point review of systems has been noted in the history of present illness.  Physical Exam Triage Vital Signs ED Triage Vitals  Enc Vitals Group     BP 01/30/21 0827 (!) 147/82     Pulse Rate 01/30/21 0827 72     Resp 01/30/21 0827 18     Temp 01/30/21 0827 98.3 F (36.8 C)     Temp Source 01/30/21 0827 Oral     SpO2 01/30/21 0827 98 %     Weight --      Height --      Head Circumference --      Peak Flow --  Pain Score 01/30/21 0826 5     Pain Loc --      Pain Edu? --      Excl. in [place]? --   No data found.  Updated Vital Signs BP (!) 155/85 (BP Location: Right Arm)   Pulse 85   Temp 98.8 F (37.1 C) (Oral)   Resp 18   LMP 01/03/2022   SpO2 94%   Physical Exam Vitals and nursing note reviewed.  Constitutional:      General: She is not in acute distress.    Appearance: Normal appearance. She is not ill-appearing.  HENT:     Head: Normocephalic and atraumatic.     Salivary Glands: Right salivary gland is not diffusely enlarged or tender. Left salivary gland is not diffusely enlarged or tender.     Right Ear: Ear canal and external ear normal. No drainage. A middle ear effusion is present. There is no impacted cerumen. Tympanic membrane is bulging. Tympanic membrane is not injected or  erythematous.     Left Ear: Ear canal and external ear normal. No drainage. A middle ear effusion is present. There is no impacted cerumen. Tympanic membrane is bulging. Tympanic membrane is not injected or erythematous.     Ears:     Comments: Bilateral EACs normal, both TMs bulging with clear fluid    Nose: Rhinorrhea present. No nasal deformity, septal deviation, signs of injury, nasal tenderness, mucosal edema or congestion. Rhinorrhea is clear.     Right Nostril: Occlusion present. No foreign body, epistaxis or septal hematoma.     Left Nostril: Occlusion present. No foreign body, epistaxis or septal hematoma.     Right Turbinates: Enlarged, swollen and pale.     Left Turbinates: Enlarged, swollen and pale.     Right Sinus: No maxillary sinus tenderness or frontal sinus tenderness.     Left Sinus: No maxillary sinus tenderness or frontal sinus tenderness.     Mouth/Throat:     Lips: Pink. No lesions.     Mouth: Mucous membranes are moist. No oral lesions.     Pharynx: Oropharynx is clear. Uvula midline. No posterior oropharyngeal erythema or uvula swelling.     Tonsils: No tonsillar exudate. 0 on the right. 0 on the left.     Comments: Postnasal drip Eyes:     General: Lids are normal.        Right eye: No discharge.        Left eye: No discharge.     Extraocular Movements: Extraocular movements intact.     Conjunctiva/sclera: Conjunctivae normal.     Right eye: Right conjunctiva is not injected.     Left eye: Left conjunctiva is not injected.  Neck:     Trachea: Trachea and phonation normal.  Cardiovascular:     Rate and Rhythm: Normal rate and regular rhythm.     Pulses: Normal pulses.     Heart sounds: Normal heart sounds. No murmur heard.    No friction rub. No gallop.  Pulmonary:     Effort: Pulmonary effort is normal. No accessory muscle usage, prolonged expiration or respiratory distress.     Breath sounds: No stridor, decreased air movement or transmitted upper airway  sounds. Examination of the right-upper field reveals rhonchi and rales. Rhonchi and rales present. No decreased breath sounds or wheezing.  Chest:     Chest wall: No tenderness.  Musculoskeletal:        General: Normal range of motion.     Cervical  back: Normal range of motion and neck supple. Normal range of motion.  Lymphadenopathy:     Cervical: No cervical adenopathy.  Skin:    General: Skin is warm and dry.     Findings: No erythema or rash.  Neurological:     General: No focal deficit present.     Mental Status: She is alert and oriented to person, place, and time.  Psychiatric:        Mood and Affect: Mood normal.        Behavior: Behavior normal.     Visual Acuity Right Eye Distance:   Left Eye Distance:   Bilateral Distance:    Right Eye Near:   Left Eye Near:    Bilateral Near:     [facility] Couse / Diagnostics / Procedures:     Radiology DG Chest 2 View  Result Date: 01/22/2022 CLINICAL DATA:  Cough. EXAM: CHEST - 2 VIEW COMPARISON:  December 28, 2017. FINDINGS: The heart size and mediastinal contours are within normal limits. Both lungs are clear. The visualized skeletal structures are unremarkable. IMPRESSION: No active cardiopulmonary disease. Electronically Signed   By: Marijo Conception M.D.   On: 01/22/2022 19:48    Procedures Procedures (including critical care time) EKG  Pending results:  Labs Reviewed - No data to display  Medications Ordered in UC: Medications - No data to display  UC Diagnoses / Final Clinical Impressions(s)   I have reviewed the triage vital signs and the nursing notes.  Pertinent labs & imaging results that were available during my care of the patient were reviewed by me and considered in my medical decision making (see chart for details).    Final diagnoses:  Respiratory infection  Rhinosinusitis   Patient was advised of x-ray findings.  Based on the physical exam findings and the duration of her symptoms, I have provided her  with a prescription for azithromycin for pneumonia prophylaxis.  Patient provided with Promethazine DM for nighttime cough.  Patient also provided with Atrovent nasal spray to dry up nasal congestion which is the likely cause of her perceived wheezing at night due to postnasal drip and upper respiratory congestion.  Patient was provided with Zyrtec to help reduce respiratory inflammation.  Return precautions advised.  ED Prescriptions     Medication Sig Dispense Auth. Provider   ipratropium (ATROVENT) 0.06 % nasal spray Place 2 sprays into both nostrils 3 (three) times daily. As needed for nasal congestion, runny nose 15 mL Lynden Oxford Scales, PA-C   cetirizine (ZYRTEC ALLERGY) 10 MG tablet Take 1 tablet (10 mg total) by mouth at bedtime. 90 tablet Lynden Oxford Scales, PA-C   promethazine-dextromethorphan (PROMETHAZINE-DM) 6.25-15 MG/5ML syrup Take 5 mLs by mouth 4 (four) times daily as needed for cough. 118 mL Lynden Oxford Scales, PA-C   azithromycin (ZITHROMAX) 250 MG tablet Take 2 tablets (500 mg total) by mouth daily for 1 day, THEN 1 tablet (250 mg total) daily for 4 days. 6 tablet Lynden Oxford Scales, PA-C      PDMP not reviewed this encounter.  Disposition Upon Discharge:  Condition: stable for discharge home Home: take medications as prescribed; routine discharge instructions as discussed; follow up as advised.  Patient presented with an acute illness with associated systemic symptoms and significant discomfort requiring urgent management. In my opinion, this is a condition that a prudent lay person (someone who possesses an average knowledge of health and medicine) may potentially expect to result in complications if not addressed urgently such  as respiratory distress, impairment of bodily function or dysfunction of bodily organs.   Routine symptom specific, illness specific and/or disease specific instructions were discussed with the patient and/or caregiver at length.    As such, the patient has been evaluated and assessed, work-up was performed and treatment was provided in alignment with urgent care protocols and evidence based medicine.  Patient/parent/caregiver has been advised that the patient may require follow up for further testing and treatment if the symptoms continue in spite of treatment, as clinically indicated and appropriate.  If the patient was tested for COVID-19, Influenza and/or RSV, then the patient/parent/guardian was advised to isolate at home pending the results of his/her diagnostic coronavirus test and potentially longer if they're positive. I have also advised pt that if his/her COVID-19 test returns positive, it's recommended to self-isolate for at least 10 days after symptoms first appeared AND until fever-free for 24 hours without fever reducer AND other symptoms have improved or resolved. Discussed self-isolation recommendations as well as instructions for household member/close contacts as per the [facility] and [place] DHHS, and also gave patient the [place] packet with this information.  Patient/parent/caregiver has been advised to return to the [facility] or PCP in 3-5 days if no better; to PCP or the Emergency Department if new signs and symptoms develop, or if the current signs or symptoms continue to change or worsen for further workup, evaluation and treatment as clinically indicated and appropriate  The patient will follow up with their current PCP if and as advised. If the patient does not currently have a PCP we will assist them in obtaining one.   The patient may need specialty follow up if the symptoms continue, in spite of conservative treatment and management, for further workup, evaluation, consultation and treatment as clinically indicated and appropriate.  Patient/parent/caregiver verbalized understanding and agreement of plan as discussed.  All questions were addressed during visit.  Please see discharge instructions below for further  details of plan.  Discharge Instructions:   Discharge Instructions      Your chest x-ray did not reveal any clear signs of pneumonia or bronchitis.  But, per my personal read, because your lungs do appear hazy and because you have had this cough for a week now, I recommend that you begin antibiotics to help prevent true pneumonia from setting in.  I also recommend that you begin a nasal decongestant called Atrovent nasal spray as well as an antihistamine, Zyrtec, to help reduce respiratory inflammation and calm your lungs down.  I also sent a nighttime cough medicine to the pharmacy for you at the same DM.  Please take 5 mL before bedtime to prevent nighttime cough.  Please let us know if you are not feeling better in the next 3 to 5 days.  Thank you for visiting urgent care today.      This office note has been dictated using Museum/gallery curator.  Unfortunately, this method of dictation can sometimes lead to typographical or grammatical errors.  I apologize for your inconvenience in advance if this occurs.  Please do not hesitate to reach out to me if clarification is needed.      Lynden Oxford Scales, [place] 01/22/22 1954

## 2022-01-22 NOTE — ED Triage Notes (Signed)
Pt c/o cough x 1 week and wheezing while lying down.  Home interventions: OTC meds, mucinex, hot teas, robitussin

## 2022-01-22 NOTE — Discharge Instructions (Addendum)
Your chest x-ray did not reveal any clear signs of pneumonia or bronchitis.  But, per my personal read, because your lungs do appear hazy and because you have had this cough for a week now, I recommend that you begin antibiotics to help prevent true pneumonia from setting in.  I also recommend that you begin a nasal decongestant called Atrovent nasal spray as well as an antihistamine, Zyrtec, to help reduce respiratory inflammation and calm your lungs down.  I also sent a nighttime cough medicine to the pharmacy for you at the same DM.  Please take 5 mL before bedtime to prevent nighttime cough.  Please let us know if you are not feeling better in the next 3 to 5 days.  Thank you for visiting urgent care today.

## 2022-05-04 ENCOUNTER — Ambulatory Visit: Payer: Commercial Managed Care - HMO | Admitting: Dermatology

## 2022-05-04 DIAGNOSIS — L905 Scar conditions and fibrosis of skin: Secondary | ICD-10-CM

## 2022-05-04 DIAGNOSIS — L237 Allergic contact dermatitis due to plants, except food: Secondary | ICD-10-CM

## 2022-05-04 DIAGNOSIS — L821 Other seborrheic keratosis: Secondary | ICD-10-CM | POA: Diagnosis not present

## 2022-05-04 NOTE — Patient Instructions (Addendum)
Contact Dermatitis Dermatitis is when your skin becomes red, sore, and swollen.  Contact dermatitis happens when your body reacts to something that touches the skin. There are 2 types: Irritant contact dermatitis. This is when something bothers your skin, like soap. Allergic contact dermatitis. This is when your skin touches something you are allergic to, like poison ivy. What are the causes? Irritant contact dermatitis may be caused by: Makeup. Soaps. Detergents. Bleaches. Acids. Metals, like nickel. Allergic contact dermatitis may be caused by: Plants. Chemicals. Jewelry. Latex. Medicines. Preservatives. These are things added to products to help them last longer. There may be some in your clothes. What increases the risk? Having a job where you have to be near things that bother your skin. Having asthma or eczema. What are the signs or symptoms?  Dry or flaky skin. Redness. Cracks. Itching. Moderate symptoms of this condition include: Pain or a burning feeling. Blisters. Blood or clear fluid coming from cracks in your skin. Swelling. This may be on your eyelids, mouth, or genitals. How is this treated? Your doctor will find out what is making your skin react. Then, you can protect your skin. You may need to use: Steroid creams, ointments, or medicines. Antibiotics or other ointments, if you have a skin infection. Lotion or medicines to help with itching. A bandage. Follow these instructions at home: Skin care Put moisturizer on your skin when it needs it. Put cool, wet cloths on your skin (cool compresses). Put a baking soda paste on your skin. Stir water into baking soda until it looks like a paste. Do not scratch your skin. Try not to have things rub up against your skin. Avoid tight clothing. Avoid using soaps, perfumes, and dyes. Check your skin every day for signs of infection. Check for: More redness, swelling, or pain. More fluid or blood. Warmth. Pus  or a bad smell. Medicines Take or apply over-the-counter and prescription medicines only as told by your doctor. If you were prescribed antibiotics, take or apply them as told by your doctor. Do not stop using them even if you start to feel better. Bathing Take a bath with: Epsom salts. Baking soda. Colloidal oatmeal. Bathe less often. Bathe in warm water. Try not to use hot water. Bandage care If you were given a bandage, change it as told by your doctor. Wash your hands with soap and water for at least 20 seconds before and after you change your bandage. If you cannot use soap and water, use hand sanitizer. General instructions Avoid the things that caused your reaction. If you don't know what caused it, keep a journal. Write down: What you eat. What skin products you use. What you drink. What you wear. Contact a doctor if: You do not get better with treatment. You get worse. You have signs of infection. You have a fever. You have new symptoms. Your bone or joint near the area hurts after the skin has healed. Get help right away if: You see red streaks coming from the area. The area turns darker. You have trouble breathing. This information is not intended to replace advice given to you by your health care provider. Make sure you discuss any questions you have with your health care provider. Document Revised: 09/25/2021 Document Reviewed: 09/25/2021 Elsevier Patient Education  [place].        Due to recent changes in healthcare laws, you may see results of your pathology and/or laboratory studies on MyChart before the doctors have had  a chance to review them. We understand that in some cases there may be results that are confusing or concerning to you. Please understand that not all results are received at the same time and often the doctors may need to interpret multiple results in order to provide you with the best plan of care or course of treatment.  Therefore, we ask that you please give [place] 2 business days to thoroughly review all your results before contacting the office for clarification. Should we see a critical lab result, you will be contacted sooner.   If You Need Anything After Your Visit  If you have any questions or concerns for your doctor, please call our main line at [phone] and press option 4 to reach your doctor's medical assistant. If no one answers, please leave a voicemail as directed and we will return your call as soon as possible. Messages left after 4 pm will be answered the following business day.   You may also send [place] a message via [place]. We typically respond to MyChart messages within 1-2 business days.  For prescription refills, please ask your pharmacy to contact our office. Our fax number is [phone].  If you have an urgent issue when the clinic is closed that cannot wait until the next business day, you can page your doctor at the number below.    Please note that while we do our best to be available for urgent issues outside of office hours, we are not available 24/7.   If you have an urgent issue and are unable to reach [place], you may choose to seek medical care at your doctor's office, retail clinic, urgent care center, or emergency room.  If you have a medical emergency, please immediately call 911 or go to the emergency department.  Pager Numbers  - Dr. Nehemiah Massed: [phone]  - Dr. Laurence Ferrari: [phone]  - Dr. Nicole Kindred: [phone]  In the event of inclement weather, please call our main line at [phone] for an update on the status of any delays or closures.  Dermatology Medication Tips: Please keep the boxes that topical medications come in in order to help keep track of the instructions about where and how to use these. Pharmacies typically print the medication instructions only on the boxes and not directly on the medication tubes.   If your medication is too expensive, please  contact our office at [phone] option 4 or send [place] a message through [place].   We are unable to tell what your co-pay for medications will be in advance as this is different depending on your insurance coverage. However, we may be able to find a substitute medication at lower cost or fill out paperwork to get insurance to cover a needed medication.   If a prior authorization is required to get your medication covered by your insurance company, please allow [place] 1-2 business days to complete this process.  Drug prices often vary depending on where the prescription is filled and some pharmacies may offer cheaper prices.  The website www.goodrx.com contains coupons for medications through different pharmacies. The prices here do not account for what the cost may be with help from insurance (it may be cheaper with your insurance), but the website can give you the price if you did not use any insurance.  - You can print the associated coupon and take it with your prescription to the pharmacy.  - You may also stop by our office during regular business hours and pick up a GoodRx  coupon card.  - If you need your prescription sent electronically to a different pharmacy, notify our office through [facility] or by phone at [phone] option 4.     Si Usted Necesita Algo Despus de Su Visita  Tambin puede enviarnos un mensaje a travs de Pharmacist, community. Por lo general respondemos a los mensajes de MyChart en el transcurso de 1 a 2 das hbiles.  Para renovar recetas, por favor pida a su farmacia que se ponga en contacto con nuestra oficina. Harland Dingwall de fax es [place] [phone].  Si tiene un asunto urgente cuando la clnica est cerrada y que no puede esperar hasta el siguiente da hbil, puede llamar/localizar a su doctor(a) al nmero que aparece a continuacin.   Por favor, tenga en cuenta que aunque hacemos todo lo posible para estar disponibles para asuntos urgentes fuera del horario de  [place], no estamos disponibles las 24 horas del da, los 7 das de la [place].   Si tiene un problema urgente y no puede comunicarse con nosotros, puede optar por buscar atencin mdica  en el consultorio de su doctor(a), en una clnica privada, en un centro de atencin urgente o en una sala de emergencias.  Si tiene Engineering geologist, por favor llame inmediatamente al 911 o vaya a la sala de emergencias.  Nmeros de bper  - Dr. Nehemiah Massed: [phone]  - Dra. Moye: [phone]  - Dra. Nicole Kindred: [phone]  En caso de inclemencias del [place], por favor llame a Johnsie Kindred principal al [phone] para una actualizacin sobre el [place] de cualquier retraso o cierre.  Consejos para la medicacin en dermatologa: Por favor, guarde las cajas en las que vienen los medicamentos de uso tpico para ayudarle a seguir las instrucciones sobre dnde y cmo usarlos. Las farmacias generalmente imprimen las instrucciones del medicamento slo en las cajas y no directamente en los tubos del [place].   Si su medicamento es muy caro, por favor, pngase en contacto con Zigmund Daniel llamando al [phone] y presione la opcin 4 o envenos un mensaje a travs de Pharmacist, community.   No podemos decirle cul ser su copago por los medicamentos por adelantado ya que esto es diferente dependiendo de la cobertura de su seguro. Sin embargo, es posible que podamos encontrar un medicamento sustituto a Electrical engineer un formulario para que el seguro cubra el medicamento que se considera necesario.   Si se requiere una autorizacin previa para que su compaa de seguros [place] su medicamento, por favor permtanos de 1 a 2 das hbiles para completar este proceso.  Los precios de los medicamentos varan con frecuencia dependiendo del Environmental consultant de dnde se surte la receta y alguna farmacias pueden ofrecer precios ms baratos.  El sitio web www.goodrx.com tiene cupones para medicamentos de Airline pilot. Los  precios aqu no tienen en cuenta lo que podra costar con la ayuda del seguro (puede ser ms barato con su seguro), pero el sitio web puede darle el precio si no utiliz Research scientist (physical sciences).  - Puede imprimir el cupn correspondiente y llevarlo con su receta a la farmacia.  - Tambin puede pasar por nuestra oficina durante el horario de atencin regular y Charity fundraiser una tarjeta de cupones de GoodRx.  - Si necesita que su receta se enve electrnicamente a una farmacia diferente, informe a nuestra oficina a travs de MyChart de [place] o por telfono llamando al [phone] y presione la opcin 4.

## 2022-05-04 NOTE — Progress Notes (Signed)
   New Patient Visit  Subjective  Melissa Norris is a 47 y.o. female who presents for the following: New Patient (Initial Visit) (Patient reports developing a itchy rash over a year ago at chest after working in garden. Patient was concerned she has allergy to poison ivy/oak. She reports rash has resolved. She also reports a history of boils at right lower leg and right thigh. Patient here to establish care. ).    The following portions of the chart were reviewed this encounter and updated as appropriate:       Review of Systems:  No other skin or systemic complaints except as noted in HPI or Assessment and Plan.  Objective  Well appearing patient in no apparent distress; mood and affect are within normal limits.  A focused examination was performed including right lower leg. Relevant physical exam findings are noted in the Assessment and Plan.  face Tiny stuck-on, dark brown waxy papules  Right Lower Leg - Anterior Scar at right lower leg  body Clear today    Assessment & Plan  Seborrheic keratosis face  Benign age-related growth.  Recommend observation.  No treatment needed unless symptomatic   Scar conditions and fibrosis of skin Right Lower Leg - Anterior  History of boils at right lower leg and right thigh with resulting scar  Benign. Observe.   RTC if boils recur      Allergic contact dermatitis due to plants, except food body  Currently resolved.  RTC if new exposure/rash   Return if symptoms worsen or fail to improve.  I, Ruthell Rummage, CMA, am acting as scribe for Brendolyn Patty, MD.  Documentation: I have reviewed the above documentation for accuracy and completeness, and I agree with the above.  Brendolyn Patty MD

## 2022-06-30 ENCOUNTER — Encounter: Payer: Self-pay | Admitting: Internal Medicine

## 2022-06-30 ENCOUNTER — Ambulatory Visit: Payer: Commercial Managed Care - HMO | Attending: Internal Medicine | Admitting: Internal Medicine

## 2022-06-30 VITALS — BP 130/98 | HR 77 | Ht 61.5 in | Wt 288.0 lb

## 2022-06-30 DIAGNOSIS — I10 Essential (primary) hypertension: Secondary | ICD-10-CM | POA: Diagnosis not present

## 2022-06-30 DIAGNOSIS — G4733 Obstructive sleep apnea (adult) (pediatric): Secondary | ICD-10-CM

## 2022-06-30 NOTE — Progress Notes (Signed)
Cardiology Office Note:    Date:  06/30/2022   ID:  Melissa Norris, DOB April 30, 1975, MRN [id number]  PCP:  Vicenta Aly, PA   [facility] Providers Cardiologist:  Werner Lean, MD     Referring MD: Vicenta Aly, PA   CC: HTN  History of Present Illness:    Melissa Norris is a 47 y.o. female with a hx of HTN, FHX of HF, OSA, And Super morbid obesity who presents for evaluation.  Had a history of peri-partum cardiomyopathy. 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  Patient notes that she is doing well.   Since last visit notes that she missed her blood pressure medication for the past two days for being busy. There are no interval hospital/ED visit.   They are establishing with new PCP for insurance issues.  Going to [employer] with her son.  Have dramatically cut back carbs.   No chest pain or pressure .  No SOB/DOE and no PND/Orthopnea.  No weight gain or leg swelling.  No palpitations or syncope.    Past Medical History:  Diagnosis Date   Hypertension    Ingrown right big toenail    Plantar fasciitis    Posterior tibialis tendinitis of both lower extremities    Rhus dermatitis     Past Surgical History:  Procedure Laterality Date   TUBAL LIGATION      Current Medications: Current Meds  Medication Sig   amLODipine (NORVASC) 5 MG tablet Take 1 tablet (5 mg total) by mouth daily.   carvedilol (COREG) 25 MG tablet Take 1 tablet (25 mg total) by mouth 2 (two) times daily with a meal.   cetirizine (ZYRTEC ALLERGY) 10 MG tablet Take 1 tablet (10 mg total) by mouth at bedtime. (Patient taking differently: Take 10 mg by mouth as needed for allergies.)   Cholecalciferol 25 MCG (1000 UT) capsule Take by mouth daily.   Cyanocobalamin (VITAMIN B-12 PO) Take 1 tablet by mouth daily.   ipratropium (ATROVENT) 0.06 % nasal spray Place 2 sprays into both nostrils 3 (three) times daily. As needed for nasal congestion, runny nose   meloxicam  (MOBIC) 15 MG tablet Take 1 tablet (15 mg total) by mouth daily. (Patient taking differently: Take 15 mg by mouth as needed for pain.)   [DISCONTINUED] ergocalciferol (VITAMIN D2) 50000 UNITS capsule once a week.   [DISCONTINUED] promethazine-dextromethorphan (PROMETHAZINE-DM) 6.25-15 MG/5ML syrup Take 5 mLs by mouth 4 (four) times daily as needed for cough.     Allergies:   Patient has no known allergies.   Social History   Socioeconomic History   Marital status: Married    Spouse name: Not on file   Number of children: Not on file   Years of education: Not on file   Highest education level: Not on file  Occupational History   Not on file  Tobacco Use   Smoking status: Never   Smokeless tobacco: Never  Substance and Sexual Activity   Alcohol use: No    Alcohol/week: 0.0 standard drinks of alcohol   Drug use: No   Sexual activity: Not on file  Other Topics Concern   Not on file  Social History Narrative   Not on file   Social Determinants of Health   Financial Resource Strain: Not on file  Food Insecurity: Not on file  Transportation Needs: Not on file  Physical Activity: Not on file  Stress: Not on file  Social Connections: Not on file  Social: I see her with her husband  Family History: Daughter has heart failure with reduced ejection fraction unclear etiology.  ROS:   Please see the history of present illness.    All other systems reviewed and are negative.  EKGs/Labs/Other Studies Reviewed:    The following studies were reviewed today:  EKG:   01/03/22: SR rate 83   Transthoracic Echocardiogram: Date:  Results: Normal left ventricular size and systolic function with no appreciable  segmental abnormality.  Ejection fraction is visually estimated at 55-60%.  Normal size left atrium.  No significant valve disease seen.    Recent Labs: No results found for requested labs within last 365 days.  Recent Lipid Panel No results found for: "CHOL",  "TRIG", "HDL", "CHOLHDL", "VLDL", "LDLCALC", "LDLDIRECT"   Physical Exam:    VS:  BP (!) 130/98   Pulse 77   Ht 5' 1.5" (1.562 m)   Wt 288 lb (130.6 kg)   SpO2 100%   BMI 53.54 kg/m     Wt Readings from Last 3 Encounters:  06/30/22 288 lb (130.6 kg)  01/04/22 293 lb (132.9 kg)  12/31/17 279 lb 15.8 oz (127 kg)    Gen: No distress, morbid obesity  Neck: No JVD Cardiac: No Rubs or Gallops, no murmur, RRR +2 radial pulses Respiratory: Clear to auscultation bilaterally, normal effort, normal  respiratory rate GI: Soft, nontender, non-distended  MS: No  edema;  moves all extremities Integument: Skin feels warm Neuro:  At time of evaluation, alert and oriented to person/place/time/situation  Psych: Normal affect, patient feels well  ASSESSMENT:    1. Essential (primary) hypertension   2. Morbid obesity ([place])   3. Obstructive apnea     PLAN:    Essential HTN Morbid Obesity OSA Fhx of HF (daughter) with normal Echo [facility] - continue norvasc 5 mg and coreg 25 mg - She has missed two doses of her meds today; continue AMB BP follow up - discussed exercise and diet interventions  - we have offered her sleep study and referral, she is still contemplative  Six months me or APP        Medication Adjustments/Labs and Tests Ordered: Current medicines are reviewed at length with the patient today.  Concerns regarding medicines are outlined above.  No orders of the defined types were placed in this encounter.  No orders of the defined types were placed in this encounter.   Patient Instructions  Medication Instructions:  Your physician recommends that you continue on your current medications as directed. Please refer to the Current Medication list given to you today.  *If you need a refill on your cardiac medications before your next appointment, please call your pharmacy*   Lab Work: NONE If you have labs (blood work) drawn today and your tests are completely normal,  you will receive your results only by: [place] (if you have MyChart) OR A paper copy in the mail If you have any lab test that is abnormal or we need to change your treatment, we will call you to review the results.   Testing/Procedures: NONE   Follow-Up: At [facility], you and your health needs are our priority.  As part of our continuing mission to provide you with exceptional heart care, we have created designated Provider Care Teams.  These Care Teams include your primary Cardiologist (physician) and Advanced Practice Providers (APPs -  Physician Assistants and Nurse Practitioners) who all work together to provide you with the care you  need, when you need it.  We recommend signing up for the patient portal called "MyChart".  Sign up information is provided on this After Visit Summary.  MyChart is used to connect with patients for Virtual Visits (Telemedicine).  Patients are able to view lab/test results, encounter notes, upcoming appointments, etc.  Non-urgent messages can be sent to your provider as well.   To learn more about what you can do with MyChart, go to [email or web address].    Your next appointment:   6-7 months  Provider:   Rudean Haskell, MD       Signed, Werner Lean, MD  06/30/2022 10:09 AM    [place]

## 2022-06-30 NOTE — Patient Instructions (Signed)
Medication Instructions:  Your physician recommends that you continue on your current medications as directed. Please refer to the Current Medication list given to you today.  *If you need a refill on your cardiac medications before your next appointment, please call your pharmacy*   Lab Work: NONE If you have labs (blood work) drawn today and your tests are completely normal, you will receive your results only by: [place] (if you have MyChart) OR A paper copy in the mail If you have any lab test that is abnormal or we need to change your treatment, we will call you to review the results.   Testing/Procedures: NONE   Follow-Up: At [facility], you and your health needs are our priority.  As part of our continuing mission to provide you with exceptional heart care, we have created designated Provider Care Teams.  These Care Teams include your primary Cardiologist (physician) and Advanced Practice Providers (APPs -  Physician Assistants and Nurse Practitioners) who all work together to provide you with the care you need, when you need it.  We recommend signing up for the patient portal called "MyChart".  Sign up information is provided on this After Visit Summary.  MyChart is used to connect with patients for Virtual Visits (Telemedicine).  Patients are able to view lab/test results, encounter notes, upcoming appointments, etc.  Non-urgent messages can be sent to your provider as well.   To learn more about what you can do with MyChart, go to [email or web address].    Your next appointment:   6-7 months  Provider:   Rudean Haskell, MD

## 2022-11-01 ENCOUNTER — Telehealth: Payer: Self-pay | Admitting: *Deleted

## 2022-11-01 MED ORDER — CARVEDILOL 25 MG PO TABS: 25.0000 mg | ORAL_TABLET | Freq: Two times a day (BID) | ORAL | 3 refills | Status: DC

## 2022-11-01 MED ORDER — AMLODIPINE BESYLATE 5 MG PO TABS: 5.0000 mg | ORAL_TABLET | Freq: Every day | ORAL | 3 refills | Status: DC

## 2022-11-01 NOTE — Telephone Encounter (Signed)
Pt calling in to get medications refilled and spouses medication. All medications refilled and DPR sent to home address to complete.

## 2023-02-24 ENCOUNTER — Encounter: Payer: Self-pay | Admitting: Internal Medicine

## 2023-02-24 ENCOUNTER — Ambulatory Visit: Payer: Managed Care, Other (non HMO) | Attending: Internal Medicine | Admitting: Internal Medicine

## 2023-02-24 VITALS — BP 130/100 | HR 75 | Ht 61.5 in | Wt 291.0 lb

## 2023-02-24 DIAGNOSIS — G4733 Obstructive sleep apnea (adult) (pediatric): Secondary | ICD-10-CM | POA: Diagnosis not present

## 2023-02-24 DIAGNOSIS — O903 Peripartum cardiomyopathy: Secondary | ICD-10-CM

## 2023-02-24 DIAGNOSIS — I10 Essential (primary) hypertension: Secondary | ICD-10-CM

## 2023-02-24 DIAGNOSIS — Z8249 Family history of ischemic heart disease and other diseases of the circulatory system: Secondary | ICD-10-CM | POA: Diagnosis not present

## 2023-02-24 MED ORDER — HYDROCHLOROTHIAZIDE 25 MG PO TABS: 25.0000 mg | ORAL_TABLET | Freq: Every day | ORAL | 3 refills | Status: AC

## 2023-02-24 NOTE — Patient Instructions (Signed)
Medication Instructions:  Your physician has recommended you make the following change in your medication:  1-START- hydrochlorothiazide 25 mg by mouth daily.  *If you need a refill on your cardiac medications before your next appointment, please call your pharmacy*  Lab Work: Your physician recommends that you have lab work- BMET, fasting lipid panel, and Lipo (a) If you have labs (blood work) drawn today and your tests are completely normal, you will receive your results only by: MyChart Message (if you have MyChart) OR A paper copy in the mail If you have any lab test that is abnormal or we need to change your treatment, we will call you to review the results.  Testing/Procedures: None ordered today.  Follow-Up: At [facility], you and your health needs are our priority.  As part of our continuing mission to provide you with exceptional heart care, we have created designated Provider Care Teams.  These Care Teams include your primary Cardiologist (physician) and Advanced Practice Providers (APPs -  Physician Assistants and Nurse Practitioners) who all work together to provide you with the care you need, when you need it.  We recommend signing up for the patient portal called "MyChart".  Sign up information is provided on this After Visit Summary.  MyChart is used to connect with patients for Virtual Visits (Telemedicine).  Patients are able to view lab/test results, encounter notes, upcoming appointments, etc.  Non-urgent messages can be sent to your provider as well.   To learn more about what you can do with MyChart, go to [email or web address].    Your next appointment:   1 year(s)  Provider:   Christell Constant, MD

## 2023-02-24 NOTE — Progress Notes (Signed)
Cardiology Office Note:    Date:  02/24/2023   ID:  Melissa Norris, DOB [birth year], MRN [id number]  PCP:  Ansel Bong, PA   [facility] Providers Cardiologist:  Christell Constant, MD     Referring MD: Ansel Bong, PA   CC: HTN  History of Present Illness:    Melissa Norris is a 47 y.o. female with a hx of HTN, FHX of HF, OSA, And Super morbid obesity who presents for evaluation.  Had a history of peri-partum cardiomyopathy. 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 2024: Her son just got married, daughter is doing well.  Discussed the use of AI scribe software for clinical note transcription with the patient, who gave verbal consent to proceed.  MS. Melissa Norris, with a history of hypertension, hyperlipidemia, obstructive sleep apnea, peripartum cardiomyopathy, and morbid obesity, presents for a follow-up visit. The patient was previously active, attending [employer] and participating in bike rides. Issues with [employer]; they were not a good fit, she patient has been contemplating sleep testing for obstructive sleep apnea.  The patient's peripartum cardiomyopathy has since resolved, with a recovered ejection fraction noted on a recent evaluation. The patient's EKG showed a PR interval of 200 and possible septal infarct pattern. The patient reports feeling well overall, with no new health concerns. However, she expresses frustration with current world events and is contemplating moving to [place], where they own property.  The patient's blood pressure at home is typically around 125/91, which is slightly elevated. The patient has expressed reluctance to undergo sleep apnea testing and is considering starting a low-dose diuretic, hydrochlorothiazide, to manage her hypertension.    Past Medical History:  Diagnosis Date   Hypertension    Ingrown right big toenail    Plantar fasciitis    Posterior tibialis tendinitis of both lower extremities     Rhus dermatitis     Past Surgical History:  Procedure Laterality Date   TUBAL LIGATION      Current Medications: Current Meds  Medication Sig   amLODipine (NORVASC) 5 MG tablet Take 1 tablet (5 mg total) by mouth daily.   carvedilol (COREG) 25 MG tablet Take 1 tablet (25 mg total) by mouth 2 (two) times daily with a meal.   Cholecalciferol 25 MCG (1000 UT) capsule Take by mouth daily.   Cyanocobalamin (VITAMIN B-12 PO) Take 1 tablet by mouth daily.   hydrochlorothiazide (HYDRODIURIL) 25 MG tablet Take 1 tablet (25 mg total) by mouth daily.   ipratropium (ATROVENT) 0.06 % nasal spray Place 2 sprays into both nostrils 3 (three) times daily. As needed for nasal congestion, runny nose   meloxicam (MOBIC) 15 MG tablet Take 1 tablet (15 mg total) by mouth daily. (Patient taking differently: Take 15 mg by mouth as needed for pain.)     Allergies:   Patient has no known allergies.   Social History   Socioeconomic History   Marital status: Married    Spouse name: Not on file   Number of children: Not on file   Years of education: Not on file   Highest education level: Not on file  Occupational History   Not on file  Tobacco Use   Smoking status: Never   Smokeless tobacco: Never  Substance and Sexual Activity   Alcohol use: No    Alcohol/week: 0.0 standard drinks of alcohol   Drug use: No   Sexual activity: Not on file  Other Topics Concern   Not on file  Social History Narrative   Not on file   Social Determinants of Health   Financial Resource Strain: Not on file  Food Insecurity: No Food Insecurity (06/05/2020)   Received from [facility], [facility]   Hunger Vital Sign    Worried About Running Out of Food in the Last Year: Never true    Ran Out of Food in the Last Year: Never true  Transportation Needs: Not on file  Physical Activity: Not on file  Stress: Not on file  Social Connections: Unknown (08/18/2021)   Received from [facility], [facility]    Social Network    Social Network: Not on file    Social: I see her with her husband, they may move to [place]  Family History: Daughter has heart failure with reduced ejection fraction unclear etiology.  ROS:   Please see the history of present illness.      EKGs/Labs/Other Studies Reviewed:    The following studies were reviewed today:  EKG:   01/03/22: SR rate 83   Transthoracic Echocardiogram:  Normal left ventricular size and systolic function with no appreciable  segmental abnormality.  Ejection fraction is visually estimated at 55-60%.  Normal size left atrium.  No significant valve disease seen.    Recent Labs: No results found for requested labs within last 365 days.  Recent Lipid Panel No results found for: "CHOL", "TRIG", "HDL", "CHOLHDL", "VLDL", "LDLCALC", "LDLDIRECT"   Physical Exam:    VS:  BP (!) 130/100   Pulse 75   Ht 5' 1.5" (1.562 m)   Wt 291 lb (132 kg)   SpO2 97%   BMI 54.09 kg/m     Wt Readings from Last 3 Encounters:  02/24/23 291 lb (132 kg)  06/30/22 288 lb (130.6 kg)  01/04/22 293 lb (132.9 kg)    Gen: No distress, morbid obesity  Neck: No JVD Cardiac: No Rubs or Gallops, no murmur, RRR +2 radial pulses Respiratory: Clear to auscultation bilaterally, normal effort, normal  respiratory rate GI: Soft, nontender, non-distended  MS: No  edema;  moves all extremities Integument: Skin feels warm Neuro:  At time of evaluation, alert and oriented to person/place/time/situation  Psych: Normal affect, patient feels well  ASSESSMENT:    1. Essential (primary) hypertension   2. Family history of heart failure   3. Peripartum cardiomyopathy   4. OSA (obstructive sleep apnea)   5. Morbid obesity ([facility])      PLAN:    Hypertension Hx of periparptum cardiomyopathy Family history of cardiomyopathy - Hypertension with home blood pressure readings around 125/90 mmHg. History of missed medications and recent dose reduction. Discussed the  importance of blood pressure control and potential medication titration. Patient prefers not to pursue obstructive sleep apnea testing at this time. Discussed risks and benefits of starting hydrochlorothiazide, including potential electrolyte imbalance and need for monitoring. Emphasized medication adjustments if she moves to [place]. - Start hydrochlorothiazide 25 mg PO daily - Order basic metabolic panel in 1-2 weeks - Order fasting lipids in 1-2 weeks - Order Lp(a) in 1-2 weeks  Obstructive Sleep Apnea - Obstructive sleep apnea. Discussed potential benefits of treatment in lowering blood pressure. Patient is not interested in testing or treatment currently. Discussed alternative options such as dental mouth guards for symptom management.  Morbid Obesity Morbid obesity. Discussed lifestyle modifications including exercise and dietary changes. Encouraged continued physical activity and consideration of local [employer] facilities for exercise. Discussed potential benefits of weight loss on blood pressure and cardiovascular health. -  Encourage continued physical activity - Consider local [employer] facilities for exercise - Discussed lifestyle interventions if the family moves to [place]  General Health Maintenance - Discussed medication management and ensuring access to medications if patient moves to [place]. Advised on obtaining copies of medical records and ensuring medication refills before any potential move. Discussed options for verifying medication safety and cost-effectiveness in [place].       Medication Adjustments/Labs and Tests Ordered: Current medicines are reviewed at length with the patient today.  Concerns regarding medicines are outlined above.  Orders Placed This Encounter  Procedures   Basic metabolic panel   Lipid panel   Lipoprotein A (LPA)   EKG 12-Lead   Meds ordered this encounter  Medications   hydrochlorothiazide (HYDRODIURIL) 25 MG tablet    Sig: Take 1 tablet (25 mg  total) by mouth daily.    Dispense:  90 tablet    Refill:  3    Patient Instructions  Medication Instructions:  Your physician has recommended you make the following change in your medication:  1-START- hydrochlorothiazide 25 mg by mouth daily.  *If you need a refill on your cardiac medications before your next appointment, please call your pharmacy*  Lab Work: Your physician recommends that you have lab work- BMET, fasting lipid panel, and Lipo (a) If you have labs (blood work) drawn today and your tests are completely normal, you will receive your results only by: MyChart Message (if you have MyChart) OR A paper copy in the mail If you have any lab test that is abnormal or we need to change your treatment, we will call you to review the results.  Testing/Procedures: None ordered today.  Follow-Up: At [facility], you and your health needs are our priority.  As part of our continuing mission to provide you with exceptional heart care, we have created designated Provider Care Teams.  These Care Teams include your primary Cardiologist (physician) and Advanced Practice Providers (APPs -  Physician Assistants and Nurse Practitioners) who all work together to provide you with the care you need, when you need it.  We recommend signing up for the patient portal called "MyChart".  Sign up information is provided on this After Visit Summary.  MyChart is used to connect with patients for Virtual Visits (Telemedicine).  Patients are able to view lab/test results, encounter notes, upcoming appointments, etc.  Non-urgent messages can be sent to your provider as well.   To learn more about what you can do with MyChart, go to [email or web address].    Your next appointment:   1 year(s)  Provider:   Christell Constant, MD        Signed, Christell Constant, MD  02/24/2023 4:30 PM    [facility]

## 2023-04-05 LAB — BASIC METABOLIC PANEL
BUN/Creatinine Ratio: 19 (ref 9–23)
BUN: 13 mg/dL (ref 6–24)
CO2: 26 mmol/L (ref 20–29)
Calcium: 9.5 mg/dL (ref 8.7–10.2)
Chloride: 103 mmol/L (ref 96–106)
Creatinine, Ser: 0.69 mg/dL (ref 0.57–1.00)
Glucose: 115 mg/dL — ABNORMAL HIGH (ref 70–99)
Potassium: 4.9 mmol/L (ref 3.5–5.2)
Sodium: 141 mmol/L (ref 134–144)
eGFR: 108 mL/min/{1.73_m2} (ref >59)

## 2023-04-05 LAB — LIPID PANEL
Chol/HDL Ratio: 3.1 {ratio} (ref 0.0–4.4)
Cholesterol, Total: 213 mg/dL — ABNORMAL HIGH (ref 100–199)
HDL: 69 mg/dL (ref >39)
LDL Chol Calc (NIH): 116 mg/dL — ABNORMAL HIGH (ref 0–99)
Triglycerides: 164 mg/dL — ABNORMAL HIGH (ref 0–149)
VLDL Cholesterol Cal: 28 mg/dL (ref 5–40)

## 2023-04-05 LAB — LIPOPROTEIN A (LPA): Lipoprotein (a): 12.2 nmol/L (ref <75.0)

## 2023-11-01 ENCOUNTER — Encounter: Payer: Self-pay | Admitting: Podiatry

## 2023-11-01 ENCOUNTER — Ambulatory Visit (INDEPENDENT_AMBULATORY_CARE_PROVIDER_SITE_OTHER): Admitting: Podiatry

## 2023-11-01 DIAGNOSIS — M76821 Posterior tibial tendinitis, right leg: Secondary | ICD-10-CM

## 2023-11-01 MED ORDER — MELOXICAM 15 MG PO TABS: 15.0000 mg | ORAL_TABLET | Freq: Every day | ORAL | 3 refills | Status: AC

## 2023-11-01 NOTE — Progress Notes (Signed)
 She presents today for follow-up of her plantar fasciitis to her right foot as well as her posterior tibial tendinitis.  States that the meloxicam  was working very good for her but she had run out of it for a while she found some expired tablets in her cabinet and states that she started taking those and started to feel better.  Objective: Vital signs: Alert oriented x 3.  Pulses are palpable.  She has no reproducible pain on palpation medial calcaneal tubercles bilateral she has no fluctuance of the posterior tibial tendon sheath and no pain on inversion against resistance.  Assessment: Plantar fasciitis right posterior tibial tendinitis right.  Plan: Darted her back on her meloxicam  refilled meloxicam  15 mg number 91 tablet daily with 3 refills.

## 2023-11-26 ENCOUNTER — Other Ambulatory Visit: Payer: Self-pay

## 2023-11-26 ENCOUNTER — Ambulatory Visit
Admission: EM | Admit: 2023-11-26 | Discharge: 2023-11-26 | Disposition: A | Discharge status: Home / Self Care | Source: Ambulatory Visit | Attending: Family Medicine | Admitting: Family Medicine

## 2023-11-26 DIAGNOSIS — R300 Dysuria: Secondary | ICD-10-CM | POA: Diagnosis present

## 2023-11-26 DIAGNOSIS — I10 Essential (primary) hypertension: Secondary | ICD-10-CM | POA: Diagnosis present

## 2023-11-26 DIAGNOSIS — N3001 Acute cystitis with hematuria: Secondary | ICD-10-CM | POA: Insufficient documentation

## 2023-11-26 LAB — POCT URINE DIPSTICK
Bilirubin, UA: NEGATIVE
Glucose, UA: NEGATIVE mg/dL
Ketones, POC UA: NEGATIVE mg/dL
Nitrite, UA: POSITIVE — AB
POC PROTEIN,UA: >=300 — AB
Spec Grav, UA: >=1.03 — AB (ref 1.010–1.025)
Urobilinogen, UA: 0.2 U/dL
pH, UA: 6 (ref 5.0–8.0)

## 2023-11-26 LAB — POCT URINE PREGNANCY: Preg Test, Ur: NEGATIVE

## 2023-11-26 MED ORDER — CEFTRIAXONE SODIUM 1 G IJ SOLR
1.0000 g | Freq: Once | INTRAMUSCULAR | Status: AC
  Administered 2023-11-26: 1 g via INTRAMUSCULAR

## 2023-11-26 MED ORDER — CEPHALEXIN 500 MG PO CAPS: 500.0000 mg | ORAL_CAPSULE | Freq: Four times a day (QID) | ORAL | 0 refills | Status: AC

## 2023-11-26 NOTE — ED Provider Notes (Signed)
 [facility]    CSN: [id number] Arrival date & time: 11/26/23  0908      History   Chief Complaint Chief Complaint  Patient presents with   Urinary Frequency    HPI Tyjae Shvartsman is a 48 y.o. female presents for urinary symptoms.  Patient reports yesterday she she developed cloudy urine with frequent urination and urgency.  Denies burning with urination, hematuria, fevers, nausea/vomiting, flank pain.  No vaginal discharge or STD concern.  Denies history of recurrent UTIs.  No OTC treatments have been used since onset.  No other concerns at this time.   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      Home Medications    Prior to Admission medications   Medication Sig Start Date End Date Taking? Authorizing Provider  cephALEXin  (KEFLEX ) 500 MG capsule Take 1 capsule (500 mg total) by mouth 4 (four) times daily for 7 days. 11/26/23 12/03/23 Yes Elif Yonts, Jodi R, NP  amLODipine  (NORVASC ) 5 MG tablet Take 1 tablet (5 mg total) by mouth daily. 11/01/22   Santo Stanly LABOR, MD  carvedilol  (COREG ) 25 MG tablet Take 1 tablet (25 mg total) by mouth 2 (two) times daily with a meal. 11/01/22   Chandrasekhar, Mahesh A, MD  cetirizine  (ZYRTEC  ALLERGY)  10 MG tablet Take 1 tablet (10 mg total) by mouth at bedtime. Patient taking differently: Take 10 mg by mouth as needed for allergies. 01/22/22 07/21/22  Joesph Shaver Scales, PA-C  Cholecalciferol 25 MCG (1000 UT) capsule Take by mouth daily. 05/19/18   [provider]  Cyanocobalamin (VITAMIN B-12 PO) Take 1 tablet by mouth daily.    [provider]  hydrochlorothiazide  (HYDRODIURIL ) 25 MG tablet Take 1 tablet (25 mg total) by mouth daily. 02/24/23   Chandrasekhar, Stanly A, MD  ipratropium (ATROVENT ) 0.06 % nasal spray Place 2 sprays into both nostrils 3 (three) times daily. As needed for nasal congestion, runny nose 01/22/22   Joesph Shaver Scales, PA-C  meloxicam  (MOBIC ) 15 MG tablet Take 1 tablet (15 mg total) by mouth daily. 11/01/23   Hyatt, Royden DASEN, DPM    Family History History reviewed. No pertinent family history.  Social History Social History   Tobacco Use   Smoking status: Never   Smokeless tobacco: Never  Substance Use Topics   Alcohol use: No    Alcohol/week: 0.0 standard drinks of alcohol   Drug use: No     Allergies   Patient has no known allergies.   Review of Systems Review of Systems  Genitourinary:  Positive for frequency and urgency.     Physical Exam Triage Vital Signs ED Triage Vitals  Encounter Vitals Group  BP 11/26/23 0921 (!) 182/111     Girls Systolic BP Percentile --      Girls Diastolic BP Percentile --      Boys Systolic BP Percentile --      Boys Diastolic BP Percentile --      Pulse Rate 11/26/23 0921 91     Resp 11/26/23 0921 17     Temp 11/26/23 0921 99.8 F (37.7 C)     Temp Source 11/26/23 0921 Oral     SpO2 11/26/23 0921 97 %     Weight --      Height --      Head Circumference --      Peak Flow --      Pain Score 11/26/23 0919 0     Pain Loc --      Pain Education --      Exclude from Growth Chart --    No data found.  Updated Vital Signs BP (!) 170/107   Pulse 91   Temp 99.8 F (37.7 C)  (Oral)   Resp 17   LMP 11/17/2023   SpO2 97%   Visual Acuity Right Eye Distance:   Left Eye Distance:   Bilateral Distance:    Right Eye Near:   Left Eye Near:    Bilateral Near:     Physical Exam Vitals and nursing note reviewed.  Constitutional:      Appearance: Normal appearance.  HENT:     Head: Normocephalic and atraumatic.  Eyes:     Pupils: Pupils are equal, round, and reactive to light.  Cardiovascular:     Rate and Rhythm: Normal rate.  Pulmonary:     Effort: Pulmonary effort is normal.  Abdominal:     Tenderness: There is no right CVA tenderness or left CVA tenderness.  Skin:    General: Skin is warm and dry.  Neurological:     General: No focal deficit present.     Mental Status: She is alert and oriented to person, place, and time.  Psychiatric:        Mood and Affect: Mood normal.        Behavior: Behavior normal.      UC Treatments / Results  Labs (all labs ordered are listed, but only abnormal results are displayed) Labs Reviewed  POCT URINE DIPSTICK - Abnormal; Notable for the following components:      Result Value   Color, UA straw (*)    Clarity, UA cloudy (*)    Spec Grav, UA >=1.030 (*)    Blood, UA large (*)    POC PROTEIN,UA >=300 (*)    Nitrite, UA Positive (*)    Leukocytes, UA Large (3+) (*)    All other components within normal limits  URINE CULTURE  POCT URINE PREGNANCY   Basic metabolic panel Order: [id number]  Status: Final result     Next appt: None     Dx: Essential (primary) hypertension; Fam...   Test Result Released: No (inaccessible in MyChart)   1 Result Note    Component Ref Range & Units (hover) 7 mo ago  Glucose 115 High   BUN 13  Creatinine, Ser 0.69  eGFR 108  BUN/Creatinine Ratio 19  Sodium 141  Potassium 4.9  Chloride 103  CO2 26  Calcium 9.5  Resulting Agency [facility]         Narrative Performed by: HOYT Performed at:  [address] [facility] [address] [place], [place]   [id number] Lab Director:  Frankey Sas MD, Phone:  [phone]  Specimen Collected: 04/04/23 11:24 Last Resulted: 04/05/23 04:35   EKG   Radiology No results found.  Procedures Procedures (including critical care time)  Medications Ordered in UC Medications  cefTRIAXone  (ROCEPHIN ) injection 1 g (1 g Intramuscular Given 11/26/23 1019)    Initial Impression / Assessment and Plan / UC Course  I have reviewed the triage vital signs and the nursing notes.  Pertinent labs & imaging results that were available during my care of the patient were reviewed by me and considered in my medical decision making (see chart for details).     I reviewed exam and symptoms with patient.  Urine positive for UTI will send urine culture.  Patient does have low-grade fever in clinic, concern for possible pyelonephritis.  Patient given IM ceftriaxone  in clinic.  Monitored for 10 minutes after injection with no reaction noted and tolerated well.  Will start Keflex .  Encouraged rest fluids and PCP follow-up 2 days for recheck.  BP elevated on intake and recheck improved but still elevated.  Patient states she took her blood pressure medication about an hour ago.  She denies headache or chest pain or shortness of breath.  Advised to monitor keep a BP log and follow-up with her PCP to see if any medication changes are indicated.  Strict ER precautions reviewed and patient verbalized understanding. Final Clinical Impressions(s) / UC Diagnoses   Final diagnoses:  Dysuria  Acute cystitis with hematuria  Hypertension, unspecified type     Discharge Instructions      The clinic will contact you with results of the urine culture done today if positive.  You were given a shot of an antibiotic in the clinic.  Start Keflex  antibiotic 4 times a day for 7 days.  Lots of rest and fluids.  Please check your blood pressure while at home and keep a log taking this to PCP to see if there is any adjustments that need to be  made to your medications.  Please follow-up with your PCP in 2 to 3 days for recheck.  Please go to the ER for any worsening symptoms.  Hope you feel better soon!     ED Prescriptions     Medication Sig Dispense Auth. Provider   cephALEXin  (KEFLEX ) 500 MG capsule Take 1 capsule (500 mg total) by mouth 4 (four) times daily for 7 days. 28 capsule Rocko Fesperman, Jodi R, NP      PDMP not reviewed this encounter.   Loreda Myla SAUNDERS, NP 11/26/23 1034

## 2023-11-26 NOTE — Discharge Instructions (Addendum)
 The clinic will contact you with results of the urine culture done today if positive.  You were given a shot of an antibiotic in the clinic.  Start Keflex  antibiotic 4 times a day for 7 days.  Lots of rest and fluids.  Please check your blood pressure while at home and keep a log taking this to PCP to see if there is any adjustments that need to be made to your medications.  Please follow-up with your PCP in 2 to 3 days for recheck.  Please go to the ER for any worsening symptoms.  Hope you feel better soon!

## 2023-11-26 NOTE — ED Triage Notes (Signed)
 Pt c/o cloudy urine and frequent urination started yesterday.

## 2023-11-28 LAB — URINE CULTURE: Culture: >=100000 — AB

## 2023-11-29 ENCOUNTER — Ambulatory Visit (HOSPITAL_COMMUNITY): Payer: Self-pay

## 2024-02-16 ENCOUNTER — Encounter: Payer: Self-pay | Admitting: Podiatry

## 2024-02-16 ENCOUNTER — Ambulatory Visit: Admitting: Podiatry

## 2024-02-16 DIAGNOSIS — M722 Plantar fascial fibromatosis: Secondary | ICD-10-CM

## 2024-02-16 MED ORDER — TRIAMCINOLONE ACETONIDE 40 MG/ML IJ SUSP
20.0000 mg | Freq: Once | INTRAMUSCULAR | Status: AC
  Administered 2024-02-16: 20 mg

## 2024-02-17 NOTE — Progress Notes (Signed)
 Presents today for chief concern of a flareup of her posterior tibial tendinitis of her left foot.  States that the last time she was in it was her right 1 but this left foot is just really giving her a lot of grief.  Objective: Vital signs stable alert oriented x 3.  Pulses are palpable.  She has severe pain on palpation medial calcaneal tubercle of her left heel at the plantar fascia cranial insertion site pes planovalgus is noted she has some mild tenderness on palpation posterior tibial tendon.  Assessment: Plantar fasciitis left.  Plan: I injected her left heel today 20 mg Kenalog  5 mg Marcaine point of maximal tenderness.  I highly recommended orthotics we are going to try to pre-CERT for orthotics to see if we can get her that made through [facility].

## 2024-04-16 ENCOUNTER — Ambulatory Visit: Admitting: Internal Medicine

## 2024-04-23 NOTE — Progress Notes (Unsigned)
 "  Cardiology Office Note    Date:  04/24/2024  ID:  Melissa, Norris 1975/05/28, MRN [id number] PCP:  Melissa Hollering, PA  Cardiologist:  Melissa DELENA Leavens, MD  Electrophysiologist:  None   Chief Complaint: f/u HTN, history of peripartum CM  History of Present Illness: .    Melissa Norris is a 49 y.o. female with visit-pertinent history of HTN, OSA intolerant of CPAP, super morbid obesity, peripartum cardiomyopathy seen for follow-up. She was remotely seen by an outside center peripartum cardiomyopathy. The patient reports that she was diagnosed with this when her son was born (he is now 51, so ~2006). She recalls there being disagreement about whether the diagnosis was true. She saw one doctor who told her she had heart failure, then another doctor who repeated their own testing and said everything was normal. She does report there was concern about subsequent pregnancy but she went on to have another child thereafter without any difficulties. We do not have any records about what her EF had gotten down to but subsequent echo in 2019 showed normalized EF to 55-60%, normal LA, no significant valve disease. She later saw Dr. Leavens with our team for blood pressure management. She also reports her older daughter (age 3) has issues with fluid retention, unclear whether diagnosed with heart failure. She returns today with her husband for follow-up. She denies any cardiac complaints. Blood pressure is high on arrival, 162/108, recheck by me 154/88. She reports home BPs are typically 130 or less, last checked regularly around Christmas time. She reports adherence to regimen except missed her PM dose the night before last. No edema or syncope.   Labwork independently reviewed: 03/2023 LPa OK, glu 115, K 4.9, Cr 0.69, LDL 116, trig 164 2021 Hgb 11.1, plt 459  ROS: .    Please see the history of present illness. Otherwise, review of systems is positive for .  All other systems are  reviewed and otherwise negative.  Studies Reviewed: SABRA    EKG:  EKG is ordered today, personally reviewed, demonstrating:  EKG Interpretation Date/Time:  Tuesday April 24 2024 16:03:16 EST Ventricular Rate:  78 PR Interval:  196 QRS Duration:  90 QT Interval:  360 QTC Calculation: 410 R Axis:   -22  Text Interpretation: Normal sinus rhythm Left axis deviation When compared with ECG of 24-Feb-2023 15:54, No significant change was found Confirmed by Melissa Norris [phone]) on 04/24/2024 4:10:18 PM    CV Studies: Cardiac studies reviewed are outlined and summarized above. Otherwise please see EMR for full report.   Current Reported Medications:.    Active Medications[1]  Physical Exam:    VS:  BP (!) 162/108 (BP Location: Left Arm, Patient Position: Sitting, Cuff Size: Large)   Pulse 78   Ht 5' 1.5 (1.562 m)   Wt 284 lb 3.2 oz (128.9 kg)   SpO2 97%   BMI 52.83 kg/m    Wt Readings from Last 3 Encounters:  04/24/24 284 lb 3.2 oz (128.9 kg)  02/24/23 291 lb (132 kg)  06/30/22 288 lb (130.6 kg)    GEN: Well nourished, well developed in no acute distress NECK: No JVD; No carotid bruits CARDIAC: RRR, no murmurs, rubs, gallops RESPIRATORY:  Clear to auscultation without rales, wheezing or rhonchi  ABDOMEN: Soft, non-tender, non-distended EXTREMITIES:  No edema; No acute deformity   Asessement and Plan:.    1. Essential HTN likely driven in part by OSA and morbid obesity - .Suboptimal blood pressure control noted today.  She reports recent home readings in the last few weeks have been closer to normal and was surprised to see the value obtained today. BP 162/108, recheck by me 154/88. We need a better idea of what it's running at home. The patient was provided instructions on monitoring blood pressure at home for 1 week and relaying results to our office. Will await labs to help guide med changes (consider increase in amlodipine , consideration of ARB if + DM). Will also check basic  labs today including CBC, CMET, TSH rfx to free T4 today. We also discussed referral to pharmD to discuss GLP1. She does not wish to revisit pulm f/u at this time for OSA. We discussed talking to her dentist about whether a dental appliance could be considered..  2. History of peripartum cardiomyopathy - given previous diagnosis with longstanding HTN as well a possible family link (daughter with fluid retention issues?), will update echocardiogram to re-evaluate LV function.  3. Hypergylcemia - check A1C for assessment for diabetes.  4. Dyslipidemia - check CMET, lipid panel today.    Disposition: F/u with me in 2 months, after echo. Anticipate interim blood pressure medicine adjustment if needed as above.  Signed, Melissa Sames N Chabely Norby, PA-C      [1]  Current Meds  Medication Sig   amLODipine  (NORVASC ) 5 MG tablet Take 1 tablet (5 mg total) by mouth daily.   carvedilol  (COREG ) 25 MG tablet Take 1 tablet (25 mg total) by mouth 2 (two) times daily with a meal.   cetirizine  (ZYRTEC  ALLERGY) 10 MG tablet Take 1 tablet (10 mg total) by mouth at bedtime. (Patient taking differently: Take 10 mg by mouth as needed for allergies.)   Cholecalciferol 25 MCG (1000 UT) capsule Take by mouth daily.   Cyanocobalamin (VITAMIN B-12 PO) Take 1 tablet by mouth daily.   hydrochlorothiazide  (HYDRODIURIL ) 25 MG tablet Take 1 tablet (25 mg total) by mouth daily.   ipratropium (ATROVENT ) 0.06 % nasal spray Place 2 sprays into both nostrils 3 (three) times daily. As needed for nasal congestion, runny nose   meloxicam  (MOBIC ) 15 MG tablet Take 1 tablet (15 mg total) by mouth daily.   "

## 2024-04-24 ENCOUNTER — Ambulatory Visit: Attending: Physician Assistant | Admitting: Physician Assistant

## 2024-04-24 ENCOUNTER — Encounter: Payer: Self-pay | Admitting: Physician Assistant

## 2024-04-24 VITALS — BP 154/88 | HR 78 | Ht 61.5 in | Wt 284.2 lb

## 2024-04-24 DIAGNOSIS — I10 Essential (primary) hypertension: Secondary | ICD-10-CM

## 2024-04-24 DIAGNOSIS — E785 Hyperlipidemia, unspecified: Secondary | ICD-10-CM | POA: Diagnosis not present

## 2024-04-24 DIAGNOSIS — O903 Peripartum cardiomyopathy: Secondary | ICD-10-CM

## 2024-04-24 DIAGNOSIS — R739 Hyperglycemia, unspecified: Secondary | ICD-10-CM | POA: Diagnosis not present

## 2024-04-24 MED ORDER — AMLODIPINE BESYLATE 5 MG PO TABS: 5.0000 mg | ORAL_TABLET | Freq: Every day | ORAL | 3 refills | Status: AC

## 2024-04-24 MED ORDER — CARVEDILOL 25 MG PO TABS: 25.0000 mg | ORAL_TABLET | Freq: Two times a day (BID) | ORAL | 3 refills | Status: AC

## 2024-04-24 NOTE — Patient Instructions (Addendum)
 Medication Instructions:  NO CHANGES *If you need a refill on your cardiac medications before your next appointment, please call your pharmacy*  Lab Work: LIPID PANEL, CBC, CMET, HEMO A1C AND TSH RFX TO FREE T4 WITHIN 1-2 WEEKS If you have labs (blood work) drawn today and your tests are completely normal, you will receive your results only by: MyChart Message (if you have MyChart) OR A paper copy in the mail If you have any lab test that is abnormal or we need to change your treatment, we will call you to review the results.  Testing/Procedures:[address] Your physician has requested that you have an echocardiogram. Echocardiography is a painless test that uses sound waves to create images of your heart. It provides your doctor with information about the size and shape of your heart and how well your hearts chambers and valves are working. This procedure takes approximately one hour. There are no restrictions for this procedure. Please do NOT wear cologne, perfume, aftershave, or lotions (deodorant is allowed). Please arrive 15 minutes prior to your appointment time.  Please note: We ask at that you not bring children with you during ultrasound (echo/ vascular) testing. Due to room size and safety concerns, children are not allowed in the ultrasound rooms during exams. Our front office staff cannot provide observation of children in our lobby area while testing is being conducted. An adult accompanying a patient to their appointment will only be allowed in the ultrasound room at the discretion of the ultrasound technician under special circumstances. We apologize for any inconvenience.   Follow-Up: At [facility], you and your health needs are our priority.  As part of our continuing mission to provide you with exceptional heart care, our providers are all part of one team.  This team includes your primary Cardiologist (physician) and Advanced Practice Providers or APPs  (Physician Assistants and Nurse Practitioners) who all work together to provide you with the care you need, when you need it.  Your next appointment:   2 month(s)  Provider:   Dayna Dunn, PA-C  Other Instructions You have been referred to pharmD           We need to get a better idea of what your blood pressure is running at home. Here are some instructions to follow: - I would recommend using a blood pressure cuff that goes on your arm. The wrist ones can be inaccurate. If you're purchasing one for the first time, try to select one that also reports your heart rate because this can be helpful information as well. - To check your blood pressure, choose a time at least 3 hours after taking your blood pressure medicines. If you can sample it at different times of the day, that's great - it might give you more information about how your blood pressure fluctuates. Remain seated in a chair for 5 minutes quietly beforehand, then check it.  - Please record a list of those readings and call us /send in MyChart message with them for our review in 1 week.

## 2024-05-24 ENCOUNTER — Ambulatory Visit: Payer: Self-pay | Admitting: Podiatry

## 2024-06-15 ENCOUNTER — Ambulatory Visit

## 2024-06-27 ENCOUNTER — Ambulatory Visit (HOSPITAL_COMMUNITY)
